# Patient Record
Sex: Male | Born: 1970
Health system: Southern US, Community
[De-identification: ages and names within clinical notes are randomized; demographics above are authoritative.]

## PROBLEM LIST (undated history)

## (undated) DIAGNOSIS — I10 Essential (primary) hypertension: Secondary | ICD-10-CM

## (undated) DIAGNOSIS — G4730 Sleep apnea, unspecified: Secondary | ICD-10-CM

## (undated) DIAGNOSIS — K219 Gastro-esophageal reflux disease without esophagitis: Secondary | ICD-10-CM

## (undated) HISTORY — DX: Essential (primary) hypertension: I10

## (undated) HISTORY — PX: OTHER SURGICAL HISTORY: SHX169

## (undated) HISTORY — DX: Sleep apnea, unspecified: G47.30

## (undated) HISTORY — DX: Gastro-esophageal reflux disease without esophagitis: K21.9

---

## 2007-09-02 DIAGNOSIS — K219 Gastro-esophageal reflux disease without esophagitis: Secondary | ICD-10-CM | POA: Insufficient documentation

## 2011-11-07 ENCOUNTER — Telehealth: Payer: Self-pay

## 2011-11-07 ENCOUNTER — Other Ambulatory Visit: Payer: Self-pay

## 2011-11-07 DIAGNOSIS — K625 Hemorrhage of anus and rectum: Secondary | ICD-10-CM

## 2011-11-07 MED ORDER — PEG-KCL-NACL-NASULF-NA ASC-C 100 G PO SOLR: 1.0000 | Freq: Once | ORAL | Status: DC

## 2011-11-07 NOTE — Telephone Encounter (Signed)
Caylei Sperry, Can you contact Blake Ibarra, he is a close friend of mine and has seen some rectal bleeding recently. I'd like him to have colonoscopy on Monday (I know it's my day off, can we do it between 9-10am at [facility] with moderate sedation). He's a bit heavy, but otherwise pretty healthy 41 year old. Can you also call to Dr. Eilleen Kempf office to get results of recent CBC (drawn yesterday). They didn't refer him, he just called himself. thanks His cell is [phone]   Pt has been scheduled and will be here today to get instructions and sign release

## 2011-11-07 NOTE — Telephone Encounter (Signed)
Pt scheduled and has been instructed and meds reviewed

## 2011-11-10 ENCOUNTER — Encounter (HOSPITAL_COMMUNITY): Admission: RE | Payer: Self-pay | Source: Ambulatory Visit

## 2011-11-10 ENCOUNTER — Ambulatory Visit (HOSPITAL_COMMUNITY)
Admission: RE | Admit: 2011-11-10 | Payer: BC Managed Care – PPO | Source: Ambulatory Visit | Admitting: Gastroenterology

## 2011-11-10 SURGERY — COLONOSCOPY
Anesthesia: Moderate Sedation

## 2011-11-19 ENCOUNTER — Telehealth: Payer: Self-pay | Admitting: Gastroenterology

## 2011-11-19 NOTE — Telephone Encounter (Signed)
Line rings busy  

## 2011-11-19 NOTE — Telephone Encounter (Signed)
Blake Ibarra, His bleeding has stopped, he feels fine.  Wants to resechedule until after thanksgiving. Can you call him to try to find a time that will work best for him.

## 2011-11-20 NOTE — Telephone Encounter (Signed)
I have tried several times to call the pt and his number does not accept block calls.  I called his work number at [employer].  Left message on machine to call back

## 2011-11-21 NOTE — Telephone Encounter (Signed)
Pt appt was changed to 01/09/12 but he would like to be on the cx list.  I will check the schedule and call the pt if we have someone to cx.

## 2011-11-26 ENCOUNTER — Other Ambulatory Visit: Payer: BC Managed Care – PPO | Admitting: Gastroenterology

## 2011-12-01 ENCOUNTER — Telehealth: Payer: Self-pay

## 2011-12-01 NOTE — Telephone Encounter (Signed)
Message copied by Donata Duff on Mon Dec 01, 2011  8:19 AM ------      Message from: Donata Duff      Created: Fri Nov 21, 2011  3:41 PM                   ----- Message -----         From: Jessee Avers, CMA         Sent: 11/21/2011   2:24 PM           To: Donata Duff, CMA            Patient would like is scheduled from 12/2- 12/12 in the early am. Like 8:30, 9, or 9:30am. Please go ahead and put patient in and then call him to confirm date. He is now scheduled for 01/09/12 which he cannot do.

## 2011-12-02 NOTE — Telephone Encounter (Signed)
12/30/11 930 am  appt changed pt to be notified

## 2011-12-02 NOTE — Telephone Encounter (Signed)
Pt has been notified that the appt was changed to 12/30/11

## 2011-12-02 NOTE — Telephone Encounter (Signed)
Yes, please add him to that date, time.  Should book without MAC, he will do just fine with moderate sedation.

## 2011-12-02 NOTE — Telephone Encounter (Signed)
Dr Maia Breslow wants an earlier time and the dates he gave the only opening would be propofol can we add him 12/30/11 930 am propofol spot?

## 2011-12-09 ENCOUNTER — Telehealth: Payer: Self-pay

## 2011-12-09 NOTE — Telephone Encounter (Signed)
Pt has been changed to 12/24/11 3 pm Pt needs to be called and notified

## 2011-12-10 NOTE — Telephone Encounter (Signed)
Pt can not make this appt either I will continue to look for an earlier time.

## 2011-12-23 NOTE — Telephone Encounter (Signed)
Pt has been rescheduled to 12/26/11 message left

## 2011-12-24 ENCOUNTER — Other Ambulatory Visit: Payer: BC Managed Care – PPO | Admitting: Gastroenterology

## 2011-12-25 NOTE — Telephone Encounter (Signed)
Pt called to reschedule the appt for colon and will call back to get another date [phone]

## 2011-12-26 ENCOUNTER — Other Ambulatory Visit: Payer: BC Managed Care – PPO | Admitting: Gastroenterology

## 2011-12-30 ENCOUNTER — Other Ambulatory Visit: Payer: BC Managed Care – PPO | Admitting: Gastroenterology

## 2012-01-09 ENCOUNTER — Other Ambulatory Visit: Payer: BC Managed Care – PPO | Admitting: Gastroenterology

## 2012-01-21 HISTORY — PX: COLONOSCOPY: SHX174

## 2012-03-30 ENCOUNTER — Ambulatory Visit (AMBULATORY_SURGERY_CENTER): Payer: BC Managed Care – PPO | Admitting: *Deleted

## 2012-03-30 ENCOUNTER — Encounter: Payer: Self-pay | Admitting: Gastroenterology

## 2012-03-30 VITALS — Ht 67.0 in | Wt 238.8 lb

## 2012-03-30 DIAGNOSIS — K625 Hemorrhage of anus and rectum: Secondary | ICD-10-CM

## 2012-03-30 MED ORDER — NA SULFATE-K SULFATE-MG SULF 17.5-3.13-1.6 GM/177ML PO SOLN: ORAL | Status: DC

## 2012-04-05 ENCOUNTER — Encounter: Payer: Self-pay | Admitting: Gastroenterology

## 2012-04-05 ENCOUNTER — Ambulatory Visit (AMBULATORY_SURGERY_CENTER): Payer: BC Managed Care – PPO | Admitting: Gastroenterology

## 2012-04-05 VITALS — BP 116/57 | HR 59 | Temp 96.2°F | Resp 21 | Ht 67.0 in | Wt 238.0 lb

## 2012-04-05 DIAGNOSIS — D126 Benign neoplasm of colon, unspecified: Secondary | ICD-10-CM

## 2012-04-05 DIAGNOSIS — K625 Hemorrhage of anus and rectum: Secondary | ICD-10-CM

## 2012-04-05 DIAGNOSIS — K648 Other hemorrhoids: Secondary | ICD-10-CM

## 2012-04-05 MED ORDER — SODIUM CHLORIDE 0.9 % IV SOLN: 500.0000 mL | INTRAVENOUS | Status: DC

## 2012-04-05 NOTE — Progress Notes (Signed)
Patient did not experience any of the following events: a burn prior to discharge; a fall within the facility; wrong site/side/patient/procedure/implant event; or a hospital transfer or hospital admission upon discharge from the facility. (G8907) Patient did not have preoperative order for IV antibiotic SSI prophylaxis. (G8918)  

## 2012-04-05 NOTE — Patient Instructions (Addendum)
YOU HAD AN ENDOSCOPIC PROCEDURE TODAY AT THE [facility]: Refer to the procedure report that was given to you for any specific questions about what was found during the examination.  If the procedure report does not answer your questions, please call your gastroenterologist to clarify.  If you requested that your care partner not be given the details of your procedure findings, then the procedure report has been included in a sealed envelope for you to review at your convenience later.  YOU SHOULD EXPECT: Some feelings of bloating in the abdomen. Passage of more gas than usual.  Walking can help get rid of the air that was put into your GI tract during the procedure and reduce the bloating. If you had a lower endoscopy (such as a colonoscopy or flexible sigmoidoscopy) you may notice spotting of blood in your stool or on the toilet paper. If you underwent a bowel prep for your procedure, then you may not have a normal bowel movement for a few days.  DIET: Your first meal following the procedure should be a light meal and then it is ok to progress to your normal diet.  A half-sandwich or bowl of soup is an example of a good first meal.  Heavy or fried foods are harder to digest and may make you feel nauseous or bloated.  Likewise meals heavy in dairy and vegetables can cause extra gas to form and this can also increase the bloating.  Drink plenty of fluids but you should avoid alcoholic beverages for 24 hours.  ACTIVITY: Your care partner should take you home directly after the procedure.  You should plan to take it easy, moving slowly for the rest of the day.  You can resume normal activity the day after the procedure however you should NOT DRIVE or use heavy machinery for 24 hours (because of the sedation medicines used during the test).    SYMPTOMS TO REPORT IMMEDIATELY: A gastroenterologist can be reached at any hour.  During normal business hours, 8:30 AM to 5:00 PM Monday through Friday,  call [phone].  After hours and on weekends, please call the GI answering service at [phone] who will take a message and have the physician on call contact you.   Following lower endoscopy (colonoscopy or flexible sigmoidoscopy):  Excessive amounts of blood in the stool  Significant tenderness or worsening of abdominal pains  Swelling of the abdomen that is new, acute  Fever of 100F or higher  Following upper endoscopy (EGD)  Vomiting of blood or coffee ground material  New chest pain or pain under the shoulder blades  Painful or persistently difficult swallowing  New shortness of breath  Fever of 100F or higher  Black, tarry-looking stools  FOLLOW UP: If any biopsies were taken you will be contacted by phone or by letter within the next 1-3 weeks.  Call your gastroenterologist if you have not heard about the biopsies in 3 weeks.  Our staff will call the home number listed on your records the next business day following your procedure to check on you and address any questions or concerns that you may have at that time regarding the information given to you following your procedure. This is a courtesy call and so if there is no answer at the home number and we have not heard from you through the emergency physician on call, we will assume that you have returned to your regular daily activities without incident.  SIGNATURES/CONFIDENTIALITY: You and/or your care   partner have signed paperwork which will be entered into your electronic medical record.  These signatures attest to the fact that that the information above on your After Visit Summary has been reviewed and is understood.  Full responsibility of the confidentiality of this discharge information lies with you and/or your care-partner.  

## 2012-04-05 NOTE — Op Note (Signed)
[facility] [address] [place], [address]   COLONOSCOPY PROCEDURE REPORT  PATIENT: Blake Ibarra, Blake Ibarra  MR#: [id number] BIRTHDATE: 11/03/1970 , 42  yrs. old GENDER: Male ENDOSCOPIST: Rachael Fee, MD PROCEDURE DATE:  04/05/2012 PROCEDURE:   Colonoscopy with snare polypectomy ASA CLASS:   Class II INDICATIONS:minor rectal bleeding. MEDICATIONS: Fentanyl 100 mcg IV, Versed 10 mg IV, and These medications were titrated to patient response per physician's verbal order  DESCRIPTION OF PROCEDURE:   After the risks benefits and alternatives of the procedure were thoroughly explained, informed consent was obtained.  A digital rectal exam revealed no abnormalities of the rectum.   The LB [id number]  endoscope was introduced through the anus and advanced to the cecum, which was identified by both the appendix and ileocecal valve. No adverse events experienced.   The quality of the prep was good.  The instrument was then slowly withdrawn as the colon was fully examined.   COLON FINDINGS: There was a single small polyp that was removed, sent to pathology.  This was sessile, 3-35mm across, sigmoid segment, removed with snare/cautery.  There were small internal hemorrhoids.  The examination was otherwise normal.  Retroflexed views revealed no abnormalities. The time to cecum=1 minutes 16 seconds.  Withdrawal time=9 minutes 19 seconds.  The scope was withdrawn and the procedure completed. COMPLICATIONS: There were no complications.  ENDOSCOPIC IMPRESSION: There was a single small polyp that was removed, sent to pathology. There were small internal hemorrhoids.  This is the source of your recent minor bleeding. The examination was otherwise normal.  RECOMMENDATIONS: If the polyp(s) removed today are proven to be adenomatous (pre-cancerous) polyps, you will need a repeat colonoscopy in 5 years.  Otherwise you should continue to follow colorectal cancer screening  guidelines for "routine risk" patients with colonoscopy in 10 years.  You will receive a letter within 1-2 weeks with the results of your biopsy as well as final recommendations.  Please call my office if you have not received a letter after 3 weeks.   eSigned:  Rachael Fee, MD 04/05/2012 9:57 AM   cc: Eric Form, MD

## 2012-04-05 NOTE — Progress Notes (Signed)
Pt very nervous when arrived in the admission area.  He did tell us he has fainted after IV stick in the past.  Elmon Kirschner, RN started IV with out difficulity.  After the IV was in and [place] was taping it in place, the pt said he felt funny.  We lowered the HOB with cool was cloth to his forehead.  Pt instructed to take slow deep breaths.  After a few minutes, the pt stated he felt fine.  I continued with admission questions. Maw

## 2012-04-06 ENCOUNTER — Telehealth: Payer: Self-pay | Admitting: *Deleted

## 2012-04-06 NOTE — Telephone Encounter (Signed)
  Follow up Call-  Call back number 04/05/2012  Post procedure Call Back phone  # [phone] hm  Permission to leave phone message Yes     Patient questions:  Do you have a fever, pain , or abdominal swelling? no Pain Score  0 *  Have you tolerated food without any problems? yes  Have you been able to return to your normal activities? yes  Do you have any questions about your discharge instructions: Diet   no Medications  no Follow up visit  no  Do you have questions or concerns about your Care? no  Actions: * If pain score is 4 or above: No action needed, pain <4.  Everything is fine.

## 2012-04-09 ENCOUNTER — Encounter: Payer: Self-pay | Admitting: Gastroenterology

## 2012-08-10 DIAGNOSIS — M7021 Olecranon bursitis, right elbow: Secondary | ICD-10-CM | POA: Insufficient documentation

## 2013-10-12 ENCOUNTER — Telehealth: Payer: Self-pay | Admitting: *Deleted

## 2013-10-12 NOTE — Telephone Encounter (Signed)
Called and lt VM , patient needs to reschedule appt. For 10/13/13.

## 2013-10-12 NOTE — Telephone Encounter (Signed)
Called and left VM message to call back to reschedule appt for 10/13/13

## 2013-10-13 ENCOUNTER — Institutional Professional Consult (permissible substitution): Payer: Self-pay | Admitting: Neurology

## 2013-10-20 ENCOUNTER — Institutional Professional Consult (permissible substitution): Payer: Self-pay | Admitting: Neurology

## 2013-10-20 ENCOUNTER — Ambulatory Visit (INDEPENDENT_AMBULATORY_CARE_PROVIDER_SITE_OTHER): Payer: BC Managed Care – PPO | Admitting: Neurology

## 2013-10-20 ENCOUNTER — Encounter: Payer: Self-pay | Admitting: Neurology

## 2013-10-20 VITALS — BP 137/82 | HR 61 | Temp 98.0°F | Resp 16 | Ht 65.0 in | Wt 245.0 lb

## 2013-10-20 DIAGNOSIS — G4710 Hypersomnia, unspecified: Secondary | ICD-10-CM

## 2013-10-20 DIAGNOSIS — G478 Other sleep disorders: Secondary | ICD-10-CM

## 2013-10-20 DIAGNOSIS — E669 Obesity, unspecified: Secondary | ICD-10-CM

## 2013-10-20 DIAGNOSIS — R0683 Snoring: Secondary | ICD-10-CM

## 2013-10-20 DIAGNOSIS — R400 Somnolence: Secondary | ICD-10-CM

## 2013-10-20 NOTE — Patient Instructions (Signed)

## 2013-10-20 NOTE — Progress Notes (Signed)
Subjective:    Patient ID: Blake Ibarra is a 43 y.o. male.  HPI    Star Age, MD, PhD [facility] [address] [place], [place] [address]  Dear Dr. Brigitte Pulse,   I saw your patient, Blake Ibarra, upon your kind request in my neurologic clinic today for initial consultation of his sleep disorder, in particular, concern for underlying obstructive sleep apnea. The patient is unaccompanied today. As you know, [name] is a 43 year old right-handed gentleman with an underlying medical history of obesity, reflux disease, hypertension, hypogonadism on testosterone replacement, and mitral valve prolapse, who reports snoring and daytime tiredness. Snoring can be mild to loud and is intermittent. He likes to sleep on his sides, he does not sleep very much on his back. He was able to lose a lot of weight some 15 years ago, but gained some back. He goes to the gym 5-6 times per week at 5:30. He has 2 young boys. 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 He is a non-smoker, he drinks alcohol very occasionally, he drinks coffee once in the morning.   His typical bedtime is reported to be around 10 to 10:30 PM and usual wake time is around 5 AM. Sleep onset typically occurs within minutes. He reports feeling marginally rested upon awakening. He wakes up on an average 2 times in the middle of the night and has to go to the bathroom 0 to 1 times on a typical night. He reports excessive daytime somnolence (EDS) and His Epworth Sleepiness Score (ESS) is 6/24 today. He has not fallen asleep while driving. The patient has not been taking a planned nap.  He has been known to snore for the past few years. There are no overt apneas or gasping for air. He denies morning headaches. There is report of nighttime reflux, with no nighttime cough experienced. The patient has not noted any RLS symptoms and is not known to kick while asleep or before falling asleep. There is no family history of RLS or OSA,  but father is obese.   He denies cataplexy, sleep paralysis, hypnagogic or hypnopompic hallucinations, or sleep attacks. He does not report any vivid dreams, nightmares, dream enactments, or parasomnias, such as sleep talking or sleep walking. The patient has not had a sleep study or a home sleep test.   His bedroom is usually dark and cool. There is a TV in the bedroom and usually it is not on at night.   His Past Medical History Is Significant For: Past Medical History  Diagnosis Date  . Hypertension   . GERD (gastroesophageal reflux disease)     His Past Surgical History Is Significant For: Past Surgical History  Procedure Laterality Date  . No prior surgery      His Family History Is Significant For: Family History  Problem Relation Age of Onset  . Colon cancer Neg Hx   . Esophageal cancer Neg Hx   . Rectal cancer Neg Hx   . Stomach cancer Paternal Grandfather     dx in early 66's - not 100% sure    His Social History Is Significant For: History   Social History  . Marital Status: Married    Spouse Name: N/A    Number of Children: N/A  . Years of Education: N/A   Social History Main Topics  . Smoking status: Never Smoker   . Smokeless tobacco: Never Used  . Alcohol Use: Yes     Comment: occasional  .  Drug Use: No  . Sexual Activity: Not on file   Other Topics Concern  . Not on file   Social History Narrative  . No narrative on file    His Allergies Are:  No Known Allergies:   His Current Medications Are:  Outpatient Encounter Prescriptions as of 10/20/2013  Medication Sig  . amLODipine (NORVASC) 5 MG tablet Take 5 mg by mouth daily.  . Cholecalciferol (VITAMIN D-3) 5000 UNITS TABS Take by mouth daily.  Marland Kitchen lisinopril (PRINIVIL,ZESTRIL) 40 MG tablet Take 40 mg by mouth daily.  . Omega-3 Fatty Acids (FISH OIL) 1200 MG CAPS Take by mouth 2 (two) times daily.  . RABEprazole (ACIPHEX) 20 MG tablet Take 20 mg by mouth daily.  . Testosterone (AXIRON TD) Place  onto the skin daily.  :  Review of Systems:  Out of a complete 14 point review of systems, all are reviewed and negative with the exception of these symptoms as listed below:   Review of Systems  Constitutional: Positive for fatigue.  Neurological:       Snoring, insomnia, sleepiness,  Psychiatric/Behavioral:       Not enough sleep    Objective:  Neurologic Exam  Physical Exam Physical Examination:   Filed Vitals:   10/20/13 0828  BP: 137/82  Pulse: 61  Temp: 98 F (36.7 C)  Resp: 16    General Examination: The patient is a very pleasant 43 y.o. male in no acute distress. He appears well-developed and well-nourished and well groomed.   HEENT: Normocephalic, atraumatic, pupils are equal, round and reactive to light and accommodation. Funduscopic exam is normal with sharp disc margins noted. Extraocular tracking is good without limitation to gaze excursion or nystagmus noted. Normal smooth pursuit is noted. Hearing is grossly intact. Tympanic membranes are clear bilaterally. Face is symmetric with normal facial animation and normal facial sensation. Speech is clear with no dysarthria noted. There is no hypophonia. There is no lip, neck/head, jaw or voice tremor. Neck is supple with full range of passive and active motion. There are no carotid bruits on auscultation. Oropharynx exam reveals: mild mouth dryness, adequate dental hygiene and moderate airway crowding, due to narrow airway entry and thicker soft palate and elongated. Mallampati is class II. Tongue protrudes centrally and palate elevates symmetrically. Tonsils are small. Neck size is 17.25 inches. He has a nearly absent overbite. Nasal inspection reveals no significant nasal mucosal bogginess or redness and no septal deviation.   Chest: Clear to auscultation without wheezing, rhonchi or crackles noted.  Heart: S1+S2+0, regular and normal without murmurs, rubs or gallops noted.   Abdomen: Soft, non-tender and non-distended  with normal bowel sounds appreciated on auscultation.  Extremities: There is no pitting edema in the distal lower extremities bilaterally. Pedal pulses are intact.  Skin: Warm and dry without trophic changes noted. There are no varicose veins.  Musculoskeletal: exam reveals no obvious joint deformities, tenderness or joint swelling or erythema.   Neurologically:  Mental status: The patient is awake, alert and oriented in all 4 spheres. His immediate and remote memory, attention, language skills and fund of knowledge are appropriate. There is no evidence of aphasia, agnosia, apraxia or anomia. Speech is clear with normal prosody and enunciation. Thought process is linear. Mood is normal and affect is normal.  Cranial nerves II - XII are as described above under HEENT exam. In addition: shoulder shrug is normal with equal shoulder height noted. Motor exam: Normal bulk, strength and tone is noted. There is  no drift, tremor or rebound. Romberg is negative. Reflexes are 2+ throughout. Babinski: Toes are flexor bilaterally. Fine motor skills and coordination: intact with normal finger taps, normal hand movements, normal rapid alternating patting, normal foot taps and normal foot agility.  Cerebellar testing: No dysmetria or intention tremor on finger to nose testing. Heel to shin is unremarkable bilaterally. There is no truncal or gait ataxia.  Sensory exam: intact to light touch, pinprick, vibration, temperature sense in the upper and lower extremities.  Gait, station and balance: He stands easily. No veering to one side is noted. No leaning to one side is noted. Posture is age-appropriate and stance is narrow based. Gait shows normal stride length and normal pace. No problems turning are noted. He turns en bloc. Tandem walk is unremarkable. Intact toe and heel stance is noted.               Assessment and Plan:   In summary, Taelor Moncada is a very pleasant 43 y.o.-year old male with an underlying  medical history of obesity, reflux disease, hypertension, hypogonadism on testosterone replacement, and mitral valve prolapse, with a history and physical exam concerning for obstructive sleep apnea (OSA). Well he does not necessarily have a telltale history of crescendo snoring and obvious pauses in his sleep or gasping sensations, his larger next size, his obesity and tighter looking airway are certainly concerning enough to have him tested for OSA in particular since he had symptoms of nonrestorative sleep and daytime tiredness. I had a long chat with the patient about my findings and the diagnosis of OSA, its prognosis and treatment options. We talked about medical treatments, surgical interventions and non-pharmacological approaches. I explained in particular the risks and ramifications of untreated moderate to severe OSA, especially with respect to developing cardiovascular disease down the Road, including congestive heart failure, difficult to treat hypertension, cardiac arrhythmias, or stroke. Even type 2 diabetes has, in part, been linked to untreated OSA. Symptoms of untreated OSA include daytime sleepiness, memory problems, mood irritability and mood disorder such as depression and anxiety, lack of energy, as well as recurrent headaches, especially morning headaches. We talked about trying to maintain a healthy lifestyle in general, as well as the importance of weight control. I encouraged the patient to eat healthy, exercise daily and keep well hydrated, to keep a scheduled bedtime and wake time routine, to not skip any meals and eat healthy snacks in between meals. I advised the patient not to drive when feeling sleepy. I recommended the following at this time: sleep study with potential positive airway pressure titration. (We will score hypopneas at 3% and split the sleep study into diagnostic and treatment portion, if the estimated. 2 hour AHI is >15/h).   I explained the sleep test procedure to  the patient and also outlined possible surgical and non-surgical treatment options of OSA, including the use of a custom-made dental device (which would require a referral to a specialist dentist or oral surgeon), upper airway surgical options, such as pillar implants, radiofrequency surgery, tongue base surgery, and UPPP (which would involve a referral to an ENT surgeon). Rarely, jaw surgery such as mandibular advancement may be considered.  I also explained the CPAP treatment option to the patient, who indicated that he would be willing to try CPAP if the need arises. I explained the importance of being compliant with PAP treatment, not only for insurance purposes but primarily to improve His symptoms, and for the patient's long term health benefit, including  to reduce His cardiovascular risks. I answered all his questions today and the patient was in agreement. I would like to see him back after the sleep study is completed and encouraged him to call with any interim questions, concerns, problems or updates.   Thank you very much for allowing me to participate in the care of this nice patient. If I can be of any further assistance to you please do not hesitate to call me at [phone].  Sincerely,   Star Age, MD, PhD

## 2014-01-05 ENCOUNTER — Ambulatory Visit (INDEPENDENT_AMBULATORY_CARE_PROVIDER_SITE_OTHER): Payer: BC Managed Care – PPO | Admitting: Neurology

## 2014-01-05 VITALS — BP 139/76 | HR 51

## 2014-01-05 DIAGNOSIS — G4761 Periodic limb movement disorder: Secondary | ICD-10-CM

## 2014-01-05 DIAGNOSIS — G4733 Obstructive sleep apnea (adult) (pediatric): Secondary | ICD-10-CM

## 2014-01-06 NOTE — Sleep Study (Signed)
Please see the scanned sleep study interpretation located in the Procedure tab within the chart review section

## 2014-01-16 ENCOUNTER — Encounter: Payer: Self-pay | Admitting: Neurology

## 2014-01-16 ENCOUNTER — Telehealth: Payer: Self-pay | Admitting: Neurology

## 2014-01-16 DIAGNOSIS — G4733 Obstructive sleep apnea (adult) (pediatric): Secondary | ICD-10-CM

## 2014-01-16 NOTE — Telephone Encounter (Signed)
Please call and notify the patient that the recent sleep study did confirm the diagnosis of obstructive sleep apnea. Overall, Findings are mild to moderate. However, given his sleep-related complaints I do believe we should give sleep apnea treatment a try and we can do this with an autoPAP trial at home. Pls explain autoPAP to the patient and we can set up a trial at home through a DME company. I will go ahead and place an order in his chart. Thanks. Please ask him to make a followup appointment for about 6-8 weeks. Star Age, MD, PhD [facility])

## 2014-01-16 NOTE — Telephone Encounter (Signed)
Patient was contacted and provided the results of his over night sleep study in which there was a positive diagnosis of sleep apnea.  Patient was informed that a 30 day trial of CPAP had been recommended.  Patient stated that he was in the process of losing weight and did not want to use a machine.  Patient was informed of the risks of untreated sleep apnea.  Patient requested rather a follow up appointment with Dr. Rexene Alberts.  Patient to be contacted to schedule that appointment.  Dr. Marton Redwood was faxed a copy of the results.

## 2014-02-06 ENCOUNTER — Telehealth: Payer: Self-pay | Admitting: Neurology

## 2014-02-06 ENCOUNTER — Ambulatory Visit (INDEPENDENT_AMBULATORY_CARE_PROVIDER_SITE_OTHER): Payer: BLUE CROSS/BLUE SHIELD | Admitting: Adult Health

## 2014-02-06 ENCOUNTER — Encounter: Payer: Self-pay | Admitting: Neurology

## 2014-02-06 VITALS — BP 133/80 | HR 57 | Ht 67.0 in | Wt 238.6 lb

## 2014-02-06 DIAGNOSIS — G4733 Obstructive sleep apnea (adult) (pediatric): Secondary | ICD-10-CM

## 2014-02-06 DIAGNOSIS — E669 Obesity, unspecified: Secondary | ICD-10-CM

## 2014-02-06 DIAGNOSIS — G4719 Other hypersomnia: Secondary | ICD-10-CM

## 2014-02-06 NOTE — Telephone Encounter (Signed)
Patient is a no show for today's appointment (02/06/14)

## 2014-02-06 NOTE — Patient Instructions (Signed)
Sleep Apnea  Sleep apnea is a sleep disorder characterized by abnormal pauses in breathing while you sleep. When your breathing pauses, the level of oxygen in your blood decreases. This causes you to move out of deep sleep and into light sleep. As a result, your quality of sleep is poor, and the system that carries your blood throughout your body (cardiovascular system) experiences stress. If sleep apnea remains untreated, the following conditions can develop:  High blood pressure (hypertension).  Coronary artery disease.  Inability to achieve or maintain an erection (impotence).  Impairment of your thought process (cognitive dysfunction). There are three types of sleep apnea: 1. Obstructive sleep apnea--Pauses in breathing during sleep because of a blocked airway. 2. Central sleep apnea--Pauses in breathing during sleep because the area of the brain that controls your breathing does not send the correct signals to the muscles that control breathing. 3. Mixed sleep apnea--A combination of both obstructive and central sleep apnea. RISK FACTORS The following risk factors can increase your risk of developing sleep apnea:  Being overweight.  Smoking.  Having narrow passages in your nose and throat.  Being of older age.  Being male.  Alcohol use.  Sedative and tranquilizer use.  Ethnicity. Among individuals younger than 35 years, African Americans are at increased risk of sleep apnea. SYMPTOMS   Difficulty staying asleep.  Daytime sleepiness and fatigue.  Loss of energy.  Irritability.  Loud, heavy snoring.  Morning headaches.  Trouble concentrating.  Forgetfulness.  Decreased interest in sex. DIAGNOSIS  In order to diagnose sleep apnea, your caregiver will perform a physical examination. Your caregiver may suggest that you take a home sleep test. Your caregiver may also recommend that you spend the night in a sleep lab. In the sleep lab, several monitors record  information about your heart, lungs, and brain while you sleep. Your leg and arm movements and blood oxygen level are also recorded. TREATMENT The following actions may help to resolve mild sleep apnea:  Sleeping on your side.   Using a decongestant if you have nasal congestion.   Avoiding the use of depressants, including alcohol, sedatives, and narcotics.   Losing weight and modifying your diet if you are overweight. There also are devices and treatments to help open your airway:  Oral appliances. These are custom-made mouthpieces that shift your lower jaw forward and slightly open your bite. This opens your airway.  Devices that create positive airway pressure. This positive pressure "splints" your airway open to help you breathe better during sleep. The following devices create positive airway pressure:  Continuous positive airway pressure (CPAP) device. The CPAP device creates a continuous level of air pressure with an air pump. The air is delivered to your airway through a mask while you sleep. This continuous pressure keeps your airway open.  Nasal expiratory positive airway pressure (EPAP) device. The EPAP device creates positive air pressure as you exhale. The device consists of single-use valves, which are inserted into each nostril and held in place by adhesive. The valves create very little resistance when you inhale but create much more resistance when you exhale. That increased resistance creates the positive airway pressure. This positive pressure while you exhale keeps your airway open, making it easier to breath when you inhale again.  Bilevel positive airway pressure (BPAP) device. The BPAP device is used mainly in patients with central sleep apnea. This device is similar to the CPAP device because it also uses an air pump to deliver continuous air pressure   through a mask. However, with the BPAP machine, the pressure is set at two different levels. The pressure when you  exhale is lower than the pressure when you inhale.  Surgery. Typically, surgery is only done if you cannot comply with less invasive treatments or if the less invasive treatments do not improve your condition. Surgery involves removing excess tissue in your airway to create a wider passage way. Document Released: 12/27/2001 Document Revised: 05/03/2012 Document Reviewed: 05/15/2011 ExitCare Patient Information 2015 [employer]. This information is not intended to replace advice given to you by your health care provider. Make sure you discuss any questions you have with your health care provider.  

## 2014-02-06 NOTE — Progress Notes (Addendum)
PATIENT: Blake Ibarra DOB: 02/18/1970  REASON FOR VISIT: follow up- obstructive sleep apnea, obesity, daytime sleepiness HISTORY FROM: patient  HISTORY OF PRESENT ILLNESS: Mr. Blake Ibarra is a 44 year old male with a history of obstructive sleep apnea, obesity and daytime sleepiness. The patient did have a sleep study which revealed obstructive sleep apnea. It was recommended that he try the AutoPAP machine for a 30 day  trial. The patient opted not to use the machine. He states that he was in the process of losing weight. The patient returns to the office today for a follow-up. He states that he has  weight since his first visit with Dr. Rexene Alberts. The patient has a lot of questions about starting the AutoPAP versus waiting to see if he loses weight. He states that in the interim he has felt better with the weight loss. He feels that he is sleeping better at night. The patient is also concerned the AutoPAP will affect his marriage. He is concerned that with wearing a mask at his age 78 affect intimacy. This is another reason that he is hesitant to start Autopap versus just losing weight. The patient returns today with several questions or following his treatment.  HISTORY 10/20/13 Rexene Alberts): Mr. Blake Ibarra is a 44 year old right-handed gentleman with an underlying medical history of obesity, reflux disease, hypertension, hypogonadism on testosterone replacement, and mitral valve prolapse, who reports snoring and daytime tiredness. Snoring can be mild to loud and is intermittent. He likes to sleep on his sides, he does not sleep very much on his back. He was able to lose a lot of weight some 15 years ago, but gained some back. He goes to the gym 5-6 times per week at 5:30. He has 2 young boys. 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 He is a non-smoker, he drinks alcohol very occasionally, he drinks coffee once in the morning.   His typical bedtime is reported to be around 10 to 10:30 PM and usual wake time is around 5 AM. Sleep  onset typically occurs within minutes. He reports feeling marginally rested upon awakening. He wakes up on an average 2 times in the middle of the night and has to go to the bathroom 0 to 1 times on a typical night. He reports excessive daytime somnolence (EDS) and His Epworth Sleepiness Score (ESS) is 6/24 today. He has not fallen asleep while driving. The patient has not been taking a planned nap.  He has been known to snore for the past few years. There are no overt apneas or gasping for air. He denies morning headaches. There is report of nighttime reflux, with no nighttime cough experienced. The patient has not noted any RLS symptoms and is not known to kick while asleep or before falling asleep. There is no family history of RLS or OSA, but father is obese.  He denies cataplexy, sleep paralysis, hypnagogic or hypnopompic hallucinations, or sleep attacks. He does not report any vivid dreams, nightmares, dream enactments, or parasomnias, such as sleep talking or sleep walking. The patient has not had a sleep study or a home sleep test.  His bedroom is usually dark and cool. There is a TV in the bedroom and usually it is not on at night.   REVIEW OF SYSTEMS: Out of a complete 14 system review of symptoms, the patient complains only of the following symptoms, and all other reviewed systems are negative.  Fatigue, daytime sleepiness, snoring, nervous/anxious  ALLERGIES: No Known Allergies  HOME MEDICATIONS: Outpatient Prescriptions Prior  to Visit  Medication Sig Dispense Refill  . amLODipine (NORVASC) 5 MG tablet Take 5 mg by mouth daily.    . Cholecalciferol (VITAMIN D-3) 5000 UNITS TABS Take by mouth daily.    Marland Kitchen lisinopril (PRINIVIL,ZESTRIL) 40 MG tablet Take 40 mg by mouth daily.    . Omega-3 Fatty Acids (FISH OIL) 1200 MG CAPS Take by mouth 2 (two) times daily.    . RABEprazole (ACIPHEX) 20 MG tablet Take 20 mg by mouth daily.    . Testosterone (AXIRON TD) Place onto the skin daily.       No facility-administered medications prior to visit.    PAST MEDICAL HISTORY: Past Medical History  Diagnosis Date  . Hypertension   . GERD (gastroesophageal reflux disease)     PAST SURGICAL HISTORY: Past Surgical History  Procedure Laterality Date  . No prior surgery      FAMILY HISTORY: Family History  Problem Relation Age of Onset  . Colon cancer Neg Hx   . Esophageal cancer Neg Hx   . Rectal cancer Neg Hx   . Hypothyroidism Mother   . Stroke Maternal Grandmother   . Glaucoma Maternal Grandmother   . Macular degeneration Maternal Grandmother   . Cancer Maternal Grandfather     stomach    SOCIAL HISTORY: History   Social History  . Marital Status: Married    Spouse Name: N/A    Number of Children: N/A  . Years of Education: N/A   Occupational History  . Not on file.   Social History Main Topics  . Smoking status: Never Smoker   . Smokeless tobacco: Never Used  . Alcohol Use: Yes     Comment: occasional  . Drug Use: No  . Sexual Activity: Not on file   Other Topics Concern  . Not on file   Social History Narrative      PHYSICAL EXAM  Filed Vitals:   02/06/14 1551  BP: 133/80  Pulse: 57  Height: 5\' 7"  (1.702 m)  Weight: 238 lb 9.6 oz (108.228 kg)   Body mass index is 37.36 kg/(m^2). Generalized: Well developed, in no acute distress  Neck: Circumference 18 inches, Mallampati 4+  Neurological examination  Mentation: Alert oriented to time, place, history taking. Follows all commands speech and language fluent Cranial nerve II-XII:  Extraocular movements were full, visual field were full on confrontational test. Uvula tongue midline. Motor: The motor testing reveals 5 over 5 strength of all 4 extremities. Good symmetric motor tone is noted throughout.  Coordination: Cerebellar testing reveals good finger-nose-finger and heel-to-shin bilaterally.  Gait and station: Gait is normal.  Reflexes: Deep tendon reflexes are symmetric and normal  bilaterally.  Marland Kitchen   DIAGNOSTIC DATA (LABS, IMAGING, TESTING) - I reviewed patient records, labs, notes, testing and imaging myself where available.     ASSESSMENT AND PLAN 44 y.o. year old male  has a past medical history of Hypertension and GERD (gastroesophageal reflux disease). here with:   1. Obstructive sleep apnea.  2. Obesity 3. Daytime sleepiness  The patient has lost 7lbs since his first visit with at [facility]. I have advised the patient that Dr. Guadelupe Sabin recommendation is that he use the AutoPap. The patient can continue to lose weight while on the machine. The patient's sleep apnea with continued to be monitored and if the patient has significant weight loss a repeat sleep study may be indicated. This was all explained to the patient in detail. I went over his sleep study again with  him. He verbalized understanding. At this time he would like to consider his options and will call and let us know what he decides he wants to do.  I spent 25 minutes with this patient 50% of that time was spent counseling the patient on risks associated with untreated sleep apnea. I also discussed the patient's treatment options in detail as well as reviewed his recent sleep study.   Ward Givens, MSN, NP-C 02/06/2014, 4:13 PM [facility] [address] [place], [place] [address] [phone]  Note: This document was prepared with digital dictation and possible smart phrase technology. Any transcriptional errors that result from this process are unintentional.  I reviewed the above note and documentation by the Nurse Practitioner and agree with the history, physical exam, assessment and plan as outlined above. I was immediately available for face-to-face consultation. Star Age, MD, PhD [facility])

## 2014-06-08 ENCOUNTER — Ambulatory Visit: Payer: BLUE CROSS/BLUE SHIELD | Admitting: Neurology

## 2015-05-22 DIAGNOSIS — F411 Generalized anxiety disorder: Secondary | ICD-10-CM | POA: Diagnosis not present

## 2015-05-25 DIAGNOSIS — F438 Other reactions to severe stress: Secondary | ICD-10-CM | POA: Diagnosis not present

## 2015-05-29 DIAGNOSIS — F411 Generalized anxiety disorder: Secondary | ICD-10-CM | POA: Diagnosis not present

## 2015-06-06 DIAGNOSIS — F411 Generalized anxiety disorder: Secondary | ICD-10-CM | POA: Diagnosis not present

## 2015-06-11 DIAGNOSIS — F411 Generalized anxiety disorder: Secondary | ICD-10-CM | POA: Diagnosis not present

## 2015-06-15 DIAGNOSIS — F411 Generalized anxiety disorder: Secondary | ICD-10-CM | POA: Diagnosis not present

## 2015-06-15 DIAGNOSIS — R079 Chest pain, unspecified: Secondary | ICD-10-CM | POA: Diagnosis not present

## 2015-06-15 DIAGNOSIS — F438 Other reactions to severe stress: Secondary | ICD-10-CM | POA: Diagnosis not present

## 2015-06-15 DIAGNOSIS — I10 Essential (primary) hypertension: Secondary | ICD-10-CM | POA: Diagnosis not present

## 2015-06-15 DIAGNOSIS — Z6838 Body mass index (BMI) 38.0-38.9, adult: Secondary | ICD-10-CM | POA: Diagnosis not present

## 2015-06-19 DIAGNOSIS — F411 Generalized anxiety disorder: Secondary | ICD-10-CM | POA: Diagnosis not present

## 2015-06-25 DIAGNOSIS — F438 Other reactions to severe stress: Secondary | ICD-10-CM | POA: Diagnosis not present

## 2015-06-28 DIAGNOSIS — F411 Generalized anxiety disorder: Secondary | ICD-10-CM | POA: Diagnosis not present

## 2015-07-26 DIAGNOSIS — F411 Generalized anxiety disorder: Secondary | ICD-10-CM | POA: Diagnosis not present

## 2015-08-01 DIAGNOSIS — F438 Other reactions to severe stress: Secondary | ICD-10-CM | POA: Diagnosis not present

## 2015-08-02 DIAGNOSIS — F411 Generalized anxiety disorder: Secondary | ICD-10-CM | POA: Diagnosis not present

## 2015-08-07 DIAGNOSIS — Z6838 Body mass index (BMI) 38.0-38.9, adult: Secondary | ICD-10-CM | POA: Diagnosis not present

## 2015-08-07 DIAGNOSIS — R58 Hemorrhage, not elsewhere classified: Secondary | ICD-10-CM | POA: Diagnosis not present

## 2015-08-08 DIAGNOSIS — F438 Other reactions to severe stress: Secondary | ICD-10-CM | POA: Diagnosis not present

## 2015-08-09 ENCOUNTER — Ambulatory Visit: Payer: BLUE CROSS/BLUE SHIELD | Admitting: Podiatry

## 2015-08-09 DIAGNOSIS — F411 Generalized anxiety disorder: Secondary | ICD-10-CM | POA: Diagnosis not present

## 2015-09-12 DIAGNOSIS — R05 Cough: Secondary | ICD-10-CM | POA: Diagnosis not present

## 2015-09-12 DIAGNOSIS — Z6839 Body mass index (BMI) 39.0-39.9, adult: Secondary | ICD-10-CM | POA: Diagnosis not present

## 2015-09-19 DIAGNOSIS — F411 Generalized anxiety disorder: Secondary | ICD-10-CM | POA: Diagnosis not present

## 2015-10-02 DIAGNOSIS — F411 Generalized anxiety disorder: Secondary | ICD-10-CM | POA: Diagnosis not present

## 2015-10-09 DIAGNOSIS — F411 Generalized anxiety disorder: Secondary | ICD-10-CM | POA: Diagnosis not present

## 2015-10-16 DIAGNOSIS — F411 Generalized anxiety disorder: Secondary | ICD-10-CM | POA: Diagnosis not present

## 2015-10-18 DIAGNOSIS — F438 Other reactions to severe stress: Secondary | ICD-10-CM | POA: Diagnosis not present

## 2015-10-26 DIAGNOSIS — F411 Generalized anxiety disorder: Secondary | ICD-10-CM | POA: Diagnosis not present

## 2015-10-30 DIAGNOSIS — F411 Generalized anxiety disorder: Secondary | ICD-10-CM | POA: Diagnosis not present

## 2015-11-02 DIAGNOSIS — F438 Other reactions to severe stress: Secondary | ICD-10-CM | POA: Diagnosis not present

## 2015-11-08 DIAGNOSIS — F411 Generalized anxiety disorder: Secondary | ICD-10-CM | POA: Diagnosis not present

## 2015-11-13 DIAGNOSIS — F411 Generalized anxiety disorder: Secondary | ICD-10-CM | POA: Diagnosis not present

## 2015-11-15 DIAGNOSIS — Z23 Encounter for immunization: Secondary | ICD-10-CM | POA: Diagnosis not present

## 2015-11-16 DIAGNOSIS — F438 Other reactions to severe stress: Secondary | ICD-10-CM | POA: Diagnosis not present

## 2015-11-20 DIAGNOSIS — F411 Generalized anxiety disorder: Secondary | ICD-10-CM | POA: Diagnosis not present

## 2015-11-27 DIAGNOSIS — H01025 Squamous blepharitis left lower eyelid: Secondary | ICD-10-CM | POA: Diagnosis not present

## 2015-11-27 DIAGNOSIS — H01022 Squamous blepharitis right lower eyelid: Secondary | ICD-10-CM | POA: Diagnosis not present

## 2015-11-27 DIAGNOSIS — H04123 Dry eye syndrome of bilateral lacrimal glands: Secondary | ICD-10-CM | POA: Diagnosis not present

## 2015-11-27 DIAGNOSIS — F411 Generalized anxiety disorder: Secondary | ICD-10-CM | POA: Diagnosis not present

## 2015-11-27 DIAGNOSIS — H16223 Keratoconjunctivitis sicca, not specified as Sjogren's, bilateral: Secondary | ICD-10-CM | POA: Diagnosis not present

## 2015-11-30 DIAGNOSIS — F438 Other reactions to severe stress: Secondary | ICD-10-CM | POA: Diagnosis not present

## 2015-12-06 DIAGNOSIS — F411 Generalized anxiety disorder: Secondary | ICD-10-CM | POA: Diagnosis not present

## 2015-12-18 DIAGNOSIS — F411 Generalized anxiety disorder: Secondary | ICD-10-CM | POA: Diagnosis not present

## 2015-12-19 DIAGNOSIS — F438 Other reactions to severe stress: Secondary | ICD-10-CM | POA: Diagnosis not present

## 2015-12-25 DIAGNOSIS — F411 Generalized anxiety disorder: Secondary | ICD-10-CM | POA: Diagnosis not present

## 2015-12-28 DIAGNOSIS — F438 Other reactions to severe stress: Secondary | ICD-10-CM | POA: Diagnosis not present

## 2016-01-01 DIAGNOSIS — F411 Generalized anxiety disorder: Secondary | ICD-10-CM | POA: Diagnosis not present

## 2016-01-08 DIAGNOSIS — F411 Generalized anxiety disorder: Secondary | ICD-10-CM | POA: Diagnosis not present

## 2016-01-09 DIAGNOSIS — F438 Other reactions to severe stress: Secondary | ICD-10-CM | POA: Diagnosis not present

## 2016-01-25 DIAGNOSIS — F438 Other reactions to severe stress: Secondary | ICD-10-CM | POA: Diagnosis not present

## 2016-02-04 DIAGNOSIS — H16223 Keratoconjunctivitis sicca, not specified as Sjogren's, bilateral: Secondary | ICD-10-CM | POA: Diagnosis not present

## 2016-02-04 DIAGNOSIS — H01022 Squamous blepharitis right lower eyelid: Secondary | ICD-10-CM | POA: Diagnosis not present

## 2016-02-04 DIAGNOSIS — H01025 Squamous blepharitis left lower eyelid: Secondary | ICD-10-CM | POA: Diagnosis not present

## 2016-02-04 DIAGNOSIS — H04123 Dry eye syndrome of bilateral lacrimal glands: Secondary | ICD-10-CM | POA: Diagnosis not present

## 2016-02-12 DIAGNOSIS — F411 Generalized anxiety disorder: Secondary | ICD-10-CM | POA: Diagnosis not present

## 2016-02-15 DIAGNOSIS — F438 Other reactions to severe stress: Secondary | ICD-10-CM | POA: Diagnosis not present

## 2016-02-19 DIAGNOSIS — F411 Generalized anxiety disorder: Secondary | ICD-10-CM | POA: Diagnosis not present

## 2016-02-26 DIAGNOSIS — F411 Generalized anxiety disorder: Secondary | ICD-10-CM | POA: Diagnosis not present

## 2016-02-29 DIAGNOSIS — F438 Other reactions to severe stress: Secondary | ICD-10-CM | POA: Diagnosis not present

## 2016-03-04 DIAGNOSIS — F411 Generalized anxiety disorder: Secondary | ICD-10-CM | POA: Diagnosis not present

## 2016-03-11 DIAGNOSIS — Z6839 Body mass index (BMI) 39.0-39.9, adult: Secondary | ICD-10-CM | POA: Diagnosis not present

## 2016-03-11 DIAGNOSIS — R05 Cough: Secondary | ICD-10-CM | POA: Diagnosis not present

## 2016-03-11 DIAGNOSIS — R042 Hemoptysis: Secondary | ICD-10-CM | POA: Diagnosis not present

## 2016-03-14 DIAGNOSIS — F438 Other reactions to severe stress: Secondary | ICD-10-CM | POA: Diagnosis not present

## 2016-03-18 DIAGNOSIS — F411 Generalized anxiety disorder: Secondary | ICD-10-CM | POA: Diagnosis not present

## 2016-03-25 DIAGNOSIS — F411 Generalized anxiety disorder: Secondary | ICD-10-CM | POA: Diagnosis not present

## 2016-03-28 DIAGNOSIS — F438 Other reactions to severe stress: Secondary | ICD-10-CM | POA: Diagnosis not present

## 2016-04-01 DIAGNOSIS — F411 Generalized anxiety disorder: Secondary | ICD-10-CM | POA: Diagnosis not present

## 2016-04-07 DIAGNOSIS — J189 Pneumonia, unspecified organism: Secondary | ICD-10-CM | POA: Diagnosis not present

## 2016-04-07 DIAGNOSIS — R05 Cough: Secondary | ICD-10-CM | POA: Diagnosis not present

## 2016-04-07 DIAGNOSIS — J0100 Acute maxillary sinusitis, unspecified: Secondary | ICD-10-CM | POA: Diagnosis not present

## 2016-04-07 DIAGNOSIS — Z6839 Body mass index (BMI) 39.0-39.9, adult: Secondary | ICD-10-CM | POA: Diagnosis not present

## 2016-04-14 DIAGNOSIS — F438 Other reactions to severe stress: Secondary | ICD-10-CM | POA: Diagnosis not present

## 2016-04-15 DIAGNOSIS — F411 Generalized anxiety disorder: Secondary | ICD-10-CM | POA: Diagnosis not present

## 2016-05-02 DIAGNOSIS — F438 Other reactions to severe stress: Secondary | ICD-10-CM | POA: Diagnosis not present

## 2016-05-06 DIAGNOSIS — F411 Generalized anxiety disorder: Secondary | ICD-10-CM | POA: Diagnosis not present

## 2016-05-14 DIAGNOSIS — F438 Other reactions to severe stress: Secondary | ICD-10-CM | POA: Diagnosis not present

## 2016-05-20 DIAGNOSIS — F411 Generalized anxiety disorder: Secondary | ICD-10-CM | POA: Diagnosis not present

## 2016-05-22 DIAGNOSIS — Z0000 Encounter for general adult medical examination without abnormal findings: Secondary | ICD-10-CM | POA: Diagnosis not present

## 2016-05-22 DIAGNOSIS — Z125 Encounter for screening for malignant neoplasm of prostate: Secondary | ICD-10-CM | POA: Diagnosis not present

## 2016-05-22 DIAGNOSIS — I10 Essential (primary) hypertension: Secondary | ICD-10-CM | POA: Diagnosis not present

## 2016-05-29 DIAGNOSIS — E8881 Metabolic syndrome: Secondary | ICD-10-CM | POA: Diagnosis not present

## 2016-05-29 DIAGNOSIS — Z125 Encounter for screening for malignant neoplasm of prostate: Secondary | ICD-10-CM | POA: Diagnosis not present

## 2016-05-29 DIAGNOSIS — Z1389 Encounter for screening for other disorder: Secondary | ICD-10-CM | POA: Diagnosis not present

## 2016-05-29 DIAGNOSIS — E784 Other hyperlipidemia: Secondary | ICD-10-CM | POA: Diagnosis not present

## 2016-05-29 DIAGNOSIS — Z0000 Encounter for general adult medical examination without abnormal findings: Secondary | ICD-10-CM | POA: Diagnosis not present

## 2016-05-29 DIAGNOSIS — E291 Testicular hypofunction: Secondary | ICD-10-CM | POA: Diagnosis not present

## 2016-05-29 DIAGNOSIS — F411 Generalized anxiety disorder: Secondary | ICD-10-CM | POA: Diagnosis not present

## 2016-05-29 DIAGNOSIS — I10 Essential (primary) hypertension: Secondary | ICD-10-CM | POA: Diagnosis not present

## 2016-06-03 DIAGNOSIS — F411 Generalized anxiety disorder: Secondary | ICD-10-CM | POA: Diagnosis not present

## 2016-06-06 DIAGNOSIS — H01022 Squamous blepharitis right lower eyelid: Secondary | ICD-10-CM | POA: Diagnosis not present

## 2016-06-06 DIAGNOSIS — H01025 Squamous blepharitis left lower eyelid: Secondary | ICD-10-CM | POA: Diagnosis not present

## 2016-06-06 DIAGNOSIS — H16223 Keratoconjunctivitis sicca, not specified as Sjogren's, bilateral: Secondary | ICD-10-CM | POA: Diagnosis not present

## 2016-06-10 DIAGNOSIS — F411 Generalized anxiety disorder: Secondary | ICD-10-CM | POA: Diagnosis not present

## 2016-06-13 DIAGNOSIS — F438 Other reactions to severe stress: Secondary | ICD-10-CM | POA: Diagnosis not present

## 2016-06-26 DIAGNOSIS — F438 Other reactions to severe stress: Secondary | ICD-10-CM | POA: Diagnosis not present

## 2016-07-08 DIAGNOSIS — F411 Generalized anxiety disorder: Secondary | ICD-10-CM | POA: Diagnosis not present

## 2016-07-16 DIAGNOSIS — F438 Other reactions to severe stress: Secondary | ICD-10-CM | POA: Diagnosis not present

## 2016-07-22 DIAGNOSIS — F411 Generalized anxiety disorder: Secondary | ICD-10-CM | POA: Diagnosis not present

## 2016-07-30 DIAGNOSIS — F438 Other reactions to severe stress: Secondary | ICD-10-CM | POA: Diagnosis not present

## 2016-08-13 DIAGNOSIS — M26623 Arthralgia of bilateral temporomandibular joint: Secondary | ICD-10-CM | POA: Diagnosis not present

## 2016-08-13 DIAGNOSIS — J300 Vasomotor rhinitis: Secondary | ICD-10-CM | POA: Diagnosis not present

## 2016-09-23 DIAGNOSIS — F411 Generalized anxiety disorder: Secondary | ICD-10-CM | POA: Diagnosis not present

## 2016-09-30 DIAGNOSIS — F438 Other reactions to severe stress: Secondary | ICD-10-CM | POA: Diagnosis not present

## 2016-10-07 DIAGNOSIS — F411 Generalized anxiety disorder: Secondary | ICD-10-CM | POA: Diagnosis not present

## 2016-11-18 DIAGNOSIS — F411 Generalized anxiety disorder: Secondary | ICD-10-CM | POA: Diagnosis not present

## 2016-11-25 DIAGNOSIS — F4323 Adjustment disorder with mixed anxiety and depressed mood: Secondary | ICD-10-CM | POA: Diagnosis not present

## 2016-11-26 DIAGNOSIS — F411 Generalized anxiety disorder: Secondary | ICD-10-CM | POA: Diagnosis not present

## 2016-12-02 DIAGNOSIS — F411 Generalized anxiety disorder: Secondary | ICD-10-CM | POA: Diagnosis not present

## 2016-12-03 DIAGNOSIS — J209 Acute bronchitis, unspecified: Secondary | ICD-10-CM | POA: Diagnosis not present

## 2016-12-03 DIAGNOSIS — R05 Cough: Secondary | ICD-10-CM | POA: Diagnosis not present

## 2016-12-03 DIAGNOSIS — R6883 Chills (without fever): Secondary | ICD-10-CM | POA: Diagnosis not present

## 2016-12-16 DIAGNOSIS — F411 Generalized anxiety disorder: Secondary | ICD-10-CM | POA: Diagnosis not present

## 2016-12-18 DIAGNOSIS — F4323 Adjustment disorder with mixed anxiety and depressed mood: Secondary | ICD-10-CM | POA: Diagnosis not present

## 2016-12-23 DIAGNOSIS — F4323 Adjustment disorder with mixed anxiety and depressed mood: Secondary | ICD-10-CM | POA: Diagnosis not present

## 2016-12-30 DIAGNOSIS — F411 Generalized anxiety disorder: Secondary | ICD-10-CM | POA: Diagnosis not present

## 2017-01-09 DIAGNOSIS — F4323 Adjustment disorder with mixed anxiety and depressed mood: Secondary | ICD-10-CM | POA: Diagnosis not present

## 2017-01-29 DIAGNOSIS — F4323 Adjustment disorder with mixed anxiety and depressed mood: Secondary | ICD-10-CM | POA: Diagnosis not present

## 2017-02-05 DIAGNOSIS — F4323 Adjustment disorder with mixed anxiety and depressed mood: Secondary | ICD-10-CM | POA: Diagnosis not present

## 2017-02-10 DIAGNOSIS — F411 Generalized anxiety disorder: Secondary | ICD-10-CM | POA: Diagnosis not present

## 2017-02-12 DIAGNOSIS — F4323 Adjustment disorder with mixed anxiety and depressed mood: Secondary | ICD-10-CM | POA: Diagnosis not present

## 2017-02-19 DIAGNOSIS — F4323 Adjustment disorder with mixed anxiety and depressed mood: Secondary | ICD-10-CM | POA: Diagnosis not present

## 2017-02-25 DIAGNOSIS — F4323 Adjustment disorder with mixed anxiety and depressed mood: Secondary | ICD-10-CM | POA: Diagnosis not present

## 2017-03-03 DIAGNOSIS — F411 Generalized anxiety disorder: Secondary | ICD-10-CM | POA: Diagnosis not present

## 2017-03-04 DIAGNOSIS — F4323 Adjustment disorder with mixed anxiety and depressed mood: Secondary | ICD-10-CM | POA: Diagnosis not present

## 2017-03-12 DIAGNOSIS — F4323 Adjustment disorder with mixed anxiety and depressed mood: Secondary | ICD-10-CM | POA: Diagnosis not present

## 2017-03-17 DIAGNOSIS — F411 Generalized anxiety disorder: Secondary | ICD-10-CM | POA: Diagnosis not present

## 2017-03-18 DIAGNOSIS — Z1389 Encounter for screening for other disorder: Secondary | ICD-10-CM | POA: Diagnosis not present

## 2017-03-18 DIAGNOSIS — Z6841 Body Mass Index (BMI) 40.0 and over, adult: Secondary | ICD-10-CM | POA: Diagnosis not present

## 2017-03-18 DIAGNOSIS — R05 Cough: Secondary | ICD-10-CM | POA: Diagnosis not present

## 2017-03-18 DIAGNOSIS — J069 Acute upper respiratory infection, unspecified: Secondary | ICD-10-CM | POA: Diagnosis not present

## 2017-03-20 DIAGNOSIS — F4323 Adjustment disorder with mixed anxiety and depressed mood: Secondary | ICD-10-CM | POA: Diagnosis not present

## 2017-03-24 DIAGNOSIS — F4323 Adjustment disorder with mixed anxiety and depressed mood: Secondary | ICD-10-CM | POA: Diagnosis not present

## 2017-04-02 DIAGNOSIS — F411 Generalized anxiety disorder: Secondary | ICD-10-CM | POA: Diagnosis not present

## 2017-04-03 DIAGNOSIS — F4323 Adjustment disorder with mixed anxiety and depressed mood: Secondary | ICD-10-CM | POA: Diagnosis not present

## 2017-04-07 DIAGNOSIS — F411 Generalized anxiety disorder: Secondary | ICD-10-CM | POA: Diagnosis not present

## 2017-04-10 DIAGNOSIS — F4323 Adjustment disorder with mixed anxiety and depressed mood: Secondary | ICD-10-CM | POA: Diagnosis not present

## 2017-04-14 DIAGNOSIS — F411 Generalized anxiety disorder: Secondary | ICD-10-CM | POA: Diagnosis not present

## 2017-04-15 DIAGNOSIS — M25572 Pain in left ankle and joints of left foot: Secondary | ICD-10-CM | POA: Diagnosis not present

## 2017-04-17 DIAGNOSIS — F4323 Adjustment disorder with mixed anxiety and depressed mood: Secondary | ICD-10-CM | POA: Diagnosis not present

## 2017-04-24 DIAGNOSIS — F4323 Adjustment disorder with mixed anxiety and depressed mood: Secondary | ICD-10-CM | POA: Diagnosis not present

## 2017-04-28 DIAGNOSIS — F411 Generalized anxiety disorder: Secondary | ICD-10-CM | POA: Diagnosis not present

## 2017-04-29 DIAGNOSIS — M25572 Pain in left ankle and joints of left foot: Secondary | ICD-10-CM | POA: Diagnosis not present

## 2017-05-01 DIAGNOSIS — F4323 Adjustment disorder with mixed anxiety and depressed mood: Secondary | ICD-10-CM | POA: Diagnosis not present

## 2017-05-05 DIAGNOSIS — F411 Generalized anxiety disorder: Secondary | ICD-10-CM | POA: Diagnosis not present

## 2017-05-08 DIAGNOSIS — F4323 Adjustment disorder with mixed anxiety and depressed mood: Secondary | ICD-10-CM | POA: Diagnosis not present

## 2017-05-15 DIAGNOSIS — F4323 Adjustment disorder with mixed anxiety and depressed mood: Secondary | ICD-10-CM | POA: Diagnosis not present

## 2017-05-25 DIAGNOSIS — F411 Generalized anxiety disorder: Secondary | ICD-10-CM | POA: Diagnosis not present

## 2017-05-29 DIAGNOSIS — F4323 Adjustment disorder with mixed anxiety and depressed mood: Secondary | ICD-10-CM | POA: Diagnosis not present

## 2017-06-02 DIAGNOSIS — I10 Essential (primary) hypertension: Secondary | ICD-10-CM | POA: Diagnosis not present

## 2017-06-02 DIAGNOSIS — Z0000 Encounter for general adult medical examination without abnormal findings: Secondary | ICD-10-CM | POA: Diagnosis not present

## 2017-06-02 DIAGNOSIS — R82998 Other abnormal findings in urine: Secondary | ICD-10-CM | POA: Diagnosis not present

## 2017-06-02 DIAGNOSIS — E7849 Other hyperlipidemia: Secondary | ICD-10-CM | POA: Diagnosis not present

## 2017-06-02 DIAGNOSIS — E291 Testicular hypofunction: Secondary | ICD-10-CM | POA: Diagnosis not present

## 2017-06-02 DIAGNOSIS — F411 Generalized anxiety disorder: Secondary | ICD-10-CM | POA: Diagnosis not present

## 2017-06-05 DIAGNOSIS — F4323 Adjustment disorder with mixed anxiety and depressed mood: Secondary | ICD-10-CM | POA: Diagnosis not present

## 2017-06-09 DIAGNOSIS — Z1389 Encounter for screening for other disorder: Secondary | ICD-10-CM | POA: Diagnosis not present

## 2017-06-09 DIAGNOSIS — R7301 Impaired fasting glucose: Secondary | ICD-10-CM | POA: Diagnosis not present

## 2017-06-09 DIAGNOSIS — E7849 Other hyperlipidemia: Secondary | ICD-10-CM | POA: Diagnosis not present

## 2017-06-09 DIAGNOSIS — Z0000 Encounter for general adult medical examination without abnormal findings: Secondary | ICD-10-CM | POA: Diagnosis not present

## 2017-06-09 DIAGNOSIS — F4323 Adjustment disorder with mixed anxiety and depressed mood: Secondary | ICD-10-CM | POA: Diagnosis not present

## 2017-06-09 DIAGNOSIS — I10 Essential (primary) hypertension: Secondary | ICD-10-CM | POA: Diagnosis not present

## 2017-06-09 DIAGNOSIS — R946 Abnormal results of thyroid function studies: Secondary | ICD-10-CM | POA: Diagnosis not present

## 2017-06-12 DIAGNOSIS — F4323 Adjustment disorder with mixed anxiety and depressed mood: Secondary | ICD-10-CM | POA: Diagnosis not present

## 2017-06-17 DIAGNOSIS — F411 Generalized anxiety disorder: Secondary | ICD-10-CM | POA: Diagnosis not present

## 2017-06-19 DIAGNOSIS — F4323 Adjustment disorder with mixed anxiety and depressed mood: Secondary | ICD-10-CM | POA: Diagnosis not present

## 2017-06-26 DIAGNOSIS — F4323 Adjustment disorder with mixed anxiety and depressed mood: Secondary | ICD-10-CM | POA: Diagnosis not present

## 2017-06-28 DIAGNOSIS — H6122 Impacted cerumen, left ear: Secondary | ICD-10-CM | POA: Diagnosis not present

## 2017-06-28 DIAGNOSIS — H9209 Otalgia, unspecified ear: Secondary | ICD-10-CM | POA: Diagnosis not present

## 2017-06-29 DIAGNOSIS — H9042 Sensorineural hearing loss, unilateral, left ear, with unrestricted hearing on the contralateral side: Secondary | ICD-10-CM | POA: Diagnosis not present

## 2017-06-29 DIAGNOSIS — H6122 Impacted cerumen, left ear: Secondary | ICD-10-CM | POA: Diagnosis not present

## 2017-06-29 DIAGNOSIS — H9122 Sudden idiopathic hearing loss, left ear: Secondary | ICD-10-CM | POA: Diagnosis not present

## 2017-07-15 DIAGNOSIS — H9122 Sudden idiopathic hearing loss, left ear: Secondary | ICD-10-CM | POA: Diagnosis not present

## 2017-07-15 DIAGNOSIS — H9042 Sensorineural hearing loss, unilateral, left ear, with unrestricted hearing on the contralateral side: Secondary | ICD-10-CM | POA: Diagnosis not present

## 2017-07-28 DIAGNOSIS — F4323 Adjustment disorder with mixed anxiety and depressed mood: Secondary | ICD-10-CM | POA: Diagnosis not present

## 2017-07-28 DIAGNOSIS — R946 Abnormal results of thyroid function studies: Secondary | ICD-10-CM | POA: Diagnosis not present

## 2017-08-03 DIAGNOSIS — F411 Generalized anxiety disorder: Secondary | ICD-10-CM | POA: Diagnosis not present

## 2017-08-05 DIAGNOSIS — F4323 Adjustment disorder with mixed anxiety and depressed mood: Secondary | ICD-10-CM | POA: Diagnosis not present

## 2017-08-06 DIAGNOSIS — H9120 Sudden idiopathic hearing loss, unspecified ear: Secondary | ICD-10-CM | POA: Insufficient documentation

## 2017-09-03 DIAGNOSIS — F4323 Adjustment disorder with mixed anxiety and depressed mood: Secondary | ICD-10-CM | POA: Diagnosis not present

## 2017-09-09 DIAGNOSIS — F4323 Adjustment disorder with mixed anxiety and depressed mood: Secondary | ICD-10-CM | POA: Diagnosis not present

## 2017-09-16 DIAGNOSIS — F4323 Adjustment disorder with mixed anxiety and depressed mood: Secondary | ICD-10-CM | POA: Diagnosis not present

## 2017-09-23 DIAGNOSIS — F4323 Adjustment disorder with mixed anxiety and depressed mood: Secondary | ICD-10-CM | POA: Diagnosis not present

## 2017-09-30 DIAGNOSIS — F4323 Adjustment disorder with mixed anxiety and depressed mood: Secondary | ICD-10-CM | POA: Diagnosis not present

## 2017-10-08 DIAGNOSIS — F4323 Adjustment disorder with mixed anxiety and depressed mood: Secondary | ICD-10-CM | POA: Diagnosis not present

## 2017-10-16 DIAGNOSIS — F4323 Adjustment disorder with mixed anxiety and depressed mood: Secondary | ICD-10-CM | POA: Diagnosis not present

## 2017-10-23 DIAGNOSIS — F4323 Adjustment disorder with mixed anxiety and depressed mood: Secondary | ICD-10-CM | POA: Diagnosis not present

## 2017-11-05 DIAGNOSIS — F4323 Adjustment disorder with mixed anxiety and depressed mood: Secondary | ICD-10-CM | POA: Diagnosis not present

## 2017-11-09 ENCOUNTER — Ambulatory Visit (INDEPENDENT_AMBULATORY_CARE_PROVIDER_SITE_OTHER): Payer: BLUE CROSS/BLUE SHIELD | Admitting: Psychiatry

## 2017-11-09 DIAGNOSIS — F411 Generalized anxiety disorder: Secondary | ICD-10-CM

## 2017-11-09 NOTE — Progress Notes (Signed)
[facility] Counselor/Therapist Progress Note   Patient ID: Blake Ibarra, MRN: [id number]  Date: 11/09/2017  Timespent: 45 minutes  Treatment Type: Individual   Subjective: This is the first time the client has returned since mid July of this year.  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  His wife's full-time work has been positive for the family as a whole.  He states "she is working a lot."  He notes that there are great demands on her.  [place] lot of [place] which does not leave much time for teaching which frustrates his wife.  He reports that his youngest son is at a local elementary school which has been a huge positive for them.  They homeschooled him last year to hold him back one year.  This has worked out well at [employer]. Financially his wife working has been good.  He notes that on his salary alone they would not make it and because of that he feels guilty.  Today we focused on the idea of his wife working.  His negative cognition was"I am not making all the money."  He felt anxious and guilty in his chest.  His subjective units of distress started at a 5+.  At the end of the session it was down to a 1.  As the client used eye movement to process through these feelings and cognitions he remembered his wife yelling at him "you wanted me to work.  It is all your fault."  He asks himself the question, "have I pampered her too much?"  The client concluded that overall this has been a good thing for his family.  His 2 sons are doing well in school and for the most part his wife is enjoying her job. 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  He is going up for full professorship at [employer].  This will be a significant increase in his salary which will mitigate the loss from not teaching at [employer]. The client continues to see Dr. Altha Harm.  He and Dr. Ardath Sax are working  on his weight loss and how he responds to food.  Currently his anxiety is significantly under control and he feels emotionally he is doing very well.  We discussed the client cutting back and only seeing me once a month and then tapering off to an as needed schedule.  The client agreed to this.  Interventions:Solution Focused, Supportive and Other: EMDR  Mental Status Exam:   Appearance:   Casual     Behavior:  Appropriate  Motor:  Normal  Speech/Language:   Clear and Coherent  Affect:  Appropriate  Mood:  anxious  Thought process:  Coherent  Thought content:    Logical  Perceptual disturbances:    Normal  Orientation:  Full (Time, Place, and Person)  Attention:  Good  Concentration:  good  Memory:  Immediate  Fund of knowledge:   Good  Insight:    Good  Judgment:   Good  Impulse Control:  good    Reported Symptoms: Guilt, anxiety.  Risk Assessment: Danger to Self:  No Self-injurious Behavior: No Danger to Others: No Duty to Warn:no Physical Aggression / Violence:No  Access to Firearms a concern: No  Gang Involvement:No   Diagnosis:   ICD-10-CM   1. Generalized anxiety disorder F41.1  Plan: Boundaries, assertiveness, and self-care.  Blake Ibarra, [place]

## 2017-11-18 DIAGNOSIS — F4323 Adjustment disorder with mixed anxiety and depressed mood: Secondary | ICD-10-CM | POA: Diagnosis not present

## 2017-12-16 DIAGNOSIS — F4323 Adjustment disorder with mixed anxiety and depressed mood: Secondary | ICD-10-CM | POA: Diagnosis not present

## 2017-12-21 ENCOUNTER — Ambulatory Visit: Payer: BLUE CROSS/BLUE SHIELD | Admitting: Psychiatry

## 2017-12-23 DIAGNOSIS — F4323 Adjustment disorder with mixed anxiety and depressed mood: Secondary | ICD-10-CM | POA: Diagnosis not present

## 2018-01-06 DIAGNOSIS — F4323 Adjustment disorder with mixed anxiety and depressed mood: Secondary | ICD-10-CM | POA: Diagnosis not present

## 2018-01-26 DIAGNOSIS — F4323 Adjustment disorder with mixed anxiety and depressed mood: Secondary | ICD-10-CM | POA: Diagnosis not present

## 2018-01-28 DIAGNOSIS — H9120 Sudden idiopathic hearing loss, unspecified ear: Secondary | ICD-10-CM | POA: Diagnosis not present

## 2018-02-02 DIAGNOSIS — F4323 Adjustment disorder with mixed anxiety and depressed mood: Secondary | ICD-10-CM | POA: Diagnosis not present

## 2018-02-04 ENCOUNTER — Ambulatory Visit: Payer: BLUE CROSS/BLUE SHIELD | Admitting: Psychiatry

## 2018-02-09 DIAGNOSIS — F4323 Adjustment disorder with mixed anxiety and depressed mood: Secondary | ICD-10-CM | POA: Diagnosis not present

## 2018-02-23 DIAGNOSIS — F4323 Adjustment disorder with mixed anxiety and depressed mood: Secondary | ICD-10-CM | POA: Diagnosis not present

## 2018-03-02 DIAGNOSIS — F4323 Adjustment disorder with mixed anxiety and depressed mood: Secondary | ICD-10-CM | POA: Diagnosis not present

## 2018-03-09 DIAGNOSIS — F4323 Adjustment disorder with mixed anxiety and depressed mood: Secondary | ICD-10-CM | POA: Diagnosis not present

## 2018-03-11 ENCOUNTER — Ambulatory Visit: Payer: BLUE CROSS/BLUE SHIELD | Admitting: Psychiatry

## 2018-03-16 DIAGNOSIS — F4323 Adjustment disorder with mixed anxiety and depressed mood: Secondary | ICD-10-CM | POA: Diagnosis not present

## 2018-04-06 ENCOUNTER — Telehealth: Payer: Self-pay | Admitting: Cardiovascular Disease

## 2018-04-06 DIAGNOSIS — R7301 Impaired fasting glucose: Secondary | ICD-10-CM | POA: Diagnosis not present

## 2018-04-06 DIAGNOSIS — R079 Chest pain, unspecified: Secondary | ICD-10-CM | POA: Diagnosis not present

## 2018-04-06 DIAGNOSIS — E7849 Other hyperlipidemia: Secondary | ICD-10-CM | POA: Diagnosis not present

## 2018-04-06 DIAGNOSIS — I10 Essential (primary) hypertension: Secondary | ICD-10-CM | POA: Diagnosis not present

## 2018-04-06 DIAGNOSIS — F4323 Adjustment disorder with mixed anxiety and depressed mood: Secondary | ICD-10-CM | POA: Diagnosis not present

## 2018-04-06 NOTE — Telephone Encounter (Signed)
Called patient and LVM regarding appointment with Dr. Gwenlyn Found on Friday, April 09, 2018.

## 2018-04-09 ENCOUNTER — Encounter: Payer: Self-pay | Admitting: Cardiovascular Disease

## 2018-04-09 ENCOUNTER — Ambulatory Visit (INDEPENDENT_AMBULATORY_CARE_PROVIDER_SITE_OTHER): Payer: BLUE CROSS/BLUE SHIELD | Admitting: Cardiovascular Disease

## 2018-04-09 VITALS — BP 146/88 | HR 56 | Ht 67.0 in | Wt 250.4 lb

## 2018-04-09 DIAGNOSIS — R0789 Other chest pain: Secondary | ICD-10-CM | POA: Insufficient documentation

## 2018-04-09 DIAGNOSIS — I10 Essential (primary) hypertension: Secondary | ICD-10-CM | POA: Insufficient documentation

## 2018-04-09 DIAGNOSIS — R072 Precordial pain: Secondary | ICD-10-CM | POA: Diagnosis not present

## 2018-04-09 DIAGNOSIS — F419 Anxiety disorder, unspecified: Secondary | ICD-10-CM | POA: Insufficient documentation

## 2018-04-09 NOTE — Progress Notes (Signed)
04/09/2018 Blake Ibarra   04-20-1970  [id number]  Primary Physician Marton Redwood, MD Primary Cardiologist: Lorretta Harp MD Lupe Carney, [place]  HPI:  Blake Ibarra is a 48 y.o. severely overweight married Caucasian male father of 2 young children who works as a Curator at [employer].  He was referred by Dr. Lang Snow for cardiovascular valuation because of atypical chest pain.  His only risk factor is treated hypertension.  There is a question of mitral valve prolapse in the past.  He exercises on occasion with a trainer is asymptomatic during that time.  There is no family history for heart disease.  He is never had a heart attack or stroke.  He gets chest pressure during times of stress.   Current Meds  Medication Sig  . amLODipine (NORVASC) 5 MG tablet Take 5 mg by mouth daily.  . Cholecalciferol (VITAMIN D-3) 5000 UNITS TABS Take by mouth daily.  Marland Kitchen lisinopril (PRINIVIL,ZESTRIL) 40 MG tablet Take 40 mg by mouth daily.  . Omega-3 Fatty Acids (FISH OIL) 1200 MG CAPS Take by mouth 2 (two) times daily.  . RABEprazole (ACIPHEX) 20 MG tablet Take 20 mg by mouth daily.  . Testosterone (AXIRON TD) Place onto the skin daily.     No Known Allergies  Social History   Socioeconomic History  . Marital status: Married    Spouse name: Not on file  . Number of children: Not on file  . Years of education: Not on file  . Highest education level: Not on file  Occupational History  . Not on file  Social Needs  . Financial resource strain: Not on file  . Food insecurity:    Worry: Not on file    Inability: Not on file  . Transportation needs:    Medical: Not on file    Non-medical: Not on file  Tobacco Use  . Smoking status: Never Smoker  . Smokeless tobacco: Never Used  Substance and Sexual Activity  . Alcohol use: Yes    Comment: occasional  . Drug use: No  . Sexual activity: Not on file  Lifestyle  . Physical activity:    Days per week: Not on file   Minutes per session: Not on file  . Stress: Not on file  Relationships  . Social connections:    Talks on phone: Not on file    Gets together: Not on file    Attends religious service: Not on file    Active member of club or organization: Not on file    Attends meetings of clubs or organizations: Not on file    Relationship status: Not on file  . Intimate partner violence:    Fear of current or ex partner: Not on file    Emotionally abused: Not on file    Physically abused: Not on file    Forced sexual activity: Not on file  Other Topics Concern  . Not on file  Social History Narrative  . Not on file     Review of Systems: General: negative for chills, fever, night sweats or weight changes.  Cardiovascular: negative for chest pain, dyspnea on exertion, edema, orthopnea, palpitations, paroxysmal nocturnal dyspnea or shortness of breath Dermatological: negative for rash Respiratory: negative for cough or wheezing Urologic: negative for hematuria Abdominal: negative for nausea, vomiting, diarrhea, bright red blood per rectum, melena, or hematemesis Neurologic: negative for visual changes, syncope, or dizziness All other systems reviewed and are otherwise negative except  as noted above.    Blood pressure (!) 146/88, pulse (!) 56, height 5\' 7"  (1.702 m), weight 250 lb 6.4 oz (113.6 kg).  General appearance: alert and no distress Neck: no adenopathy, no carotid bruit, no JVD, supple, symmetrical, trachea midline and thyroid not enlarged, symmetric, no tenderness/mass/nodules Lungs: clear to auscultation bilaterally Heart: regular rate and rhythm, S1, S2 normal, no murmur, click, rub or gallop Extremities: extremities normal, atraumatic, no cyanosis or edema Pulses: 2+ and symmetric Skin: Skin color, texture, turgor normal. No rashes or lesions Neurologic: Alert and oriented X 3, normal strength and tone. Normal symmetric reflexes. Normal coordination and gait  EKG sinus  bradycardia at 56 with LVH voltage.  I personally reviewed this EKG.  ASSESSMENT AND PLAN:   Essential hypertension History of essential hypertension with blood pressure measured today 146/88.  He is on lisinopril and amlodipine.  Atypical chest pain [name] is a 48 year old gentleman referred by Dr. Brigitte Pulse for atypical chest pain.  He has had a long history of anxiety disorder and chest pain related to this.  His only risk factor for ischemic heart disease is treated hypertension.  I have a low suspicion that his chest pain is ischemically mediated.  I am going to get a coronary calcium score to further evaluate      Lorretta Harp MD [facility], [facility] 04/09/2018 11:20 AM

## 2018-04-09 NOTE — Assessment & Plan Note (Signed)
Blake Ibarra is a 48 year old gentleman referred by Dr. Brigitte Pulse for atypical chest pain.  He has had a long history of anxiety disorder and chest pain related to this.  His only risk factor for ischemic heart disease is treated hypertension.  I have a low suspicion that his chest pain is ischemically mediated.  I am going to get a coronary calcium score to further evaluate

## 2018-04-09 NOTE — Patient Instructions (Signed)
Medication Instructions:  NO CHANGE If you need a refill on your cardiac medications before your next appointment, please call your pharmacy.   Lab work: If you have labs (blood work) drawn today and your tests are completely normal, you will receive your results only by: Marland Kitchen MyChart Message (if you have MyChart) OR . A paper copy in the mail If you have any lab test that is abnormal or we need to change your treatment, we will call you to review the results.  Testing/Procedures: CORONARY CALCIUM SCORE CT=[address]  Follow-Up: At [facility], you and your health needs are our priority.  As part of our continuing mission to provide you with exceptional heart care, we have created designated Provider Care Teams.  These Care Teams include your primary Cardiologist (physician) and Advanced Practice Providers (APPs -  Physician Assistants and Nurse Practitioners) who all work together to provide you with the care you need, when you need it. Your physician recommends that you schedule a follow-up appointment in:  AS NEEDED PENDING TEST RESULTS

## 2018-04-09 NOTE — Assessment & Plan Note (Signed)
History of essential hypertension with blood pressure measured today 146/88.  He is on lisinopril and amlodipine.

## 2018-04-10 DIAGNOSIS — F4323 Adjustment disorder with mixed anxiety and depressed mood: Secondary | ICD-10-CM | POA: Diagnosis not present

## 2018-04-13 DIAGNOSIS — F4323 Adjustment disorder with mixed anxiety and depressed mood: Secondary | ICD-10-CM | POA: Diagnosis not present

## 2018-04-14 DIAGNOSIS — F4323 Adjustment disorder with mixed anxiety and depressed mood: Secondary | ICD-10-CM | POA: Diagnosis not present

## 2018-04-23 DIAGNOSIS — F4323 Adjustment disorder with mixed anxiety and depressed mood: Secondary | ICD-10-CM | POA: Diagnosis not present

## 2018-05-07 DIAGNOSIS — F4323 Adjustment disorder with mixed anxiety and depressed mood: Secondary | ICD-10-CM | POA: Diagnosis not present

## 2018-05-21 DIAGNOSIS — F4323 Adjustment disorder with mixed anxiety and depressed mood: Secondary | ICD-10-CM | POA: Diagnosis not present

## 2018-05-24 ENCOUNTER — Other Ambulatory Visit: Payer: BLUE CROSS/BLUE SHIELD

## 2018-06-09 DIAGNOSIS — Z125 Encounter for screening for malignant neoplasm of prostate: Secondary | ICD-10-CM | POA: Diagnosis not present

## 2018-06-09 DIAGNOSIS — I10 Essential (primary) hypertension: Secondary | ICD-10-CM | POA: Diagnosis not present

## 2018-06-09 DIAGNOSIS — R946 Abnormal results of thyroid function studies: Secondary | ICD-10-CM | POA: Diagnosis not present

## 2018-06-09 DIAGNOSIS — E8881 Metabolic syndrome: Secondary | ICD-10-CM | POA: Diagnosis not present

## 2018-06-09 DIAGNOSIS — E291 Testicular hypofunction: Secondary | ICD-10-CM | POA: Diagnosis not present

## 2018-06-09 DIAGNOSIS — Z0000 Encounter for general adult medical examination without abnormal findings: Secondary | ICD-10-CM | POA: Diagnosis not present

## 2018-06-09 DIAGNOSIS — R82998 Other abnormal findings in urine: Secondary | ICD-10-CM | POA: Diagnosis not present

## 2018-06-09 DIAGNOSIS — E7849 Other hyperlipidemia: Secondary | ICD-10-CM | POA: Diagnosis not present

## 2018-06-11 DIAGNOSIS — F4323 Adjustment disorder with mixed anxiety and depressed mood: Secondary | ICD-10-CM | POA: Diagnosis not present

## 2018-06-16 DIAGNOSIS — I10 Essential (primary) hypertension: Secondary | ICD-10-CM | POA: Diagnosis not present

## 2018-06-16 DIAGNOSIS — E785 Hyperlipidemia, unspecified: Secondary | ICD-10-CM | POA: Diagnosis not present

## 2018-06-16 DIAGNOSIS — E8881 Metabolic syndrome: Secondary | ICD-10-CM | POA: Diagnosis not present

## 2018-06-16 DIAGNOSIS — R7301 Impaired fasting glucose: Secondary | ICD-10-CM | POA: Diagnosis not present

## 2018-06-16 DIAGNOSIS — R079 Chest pain, unspecified: Secondary | ICD-10-CM | POA: Diagnosis not present

## 2018-06-16 DIAGNOSIS — Z0000 Encounter for general adult medical examination without abnormal findings: Secondary | ICD-10-CM | POA: Diagnosis not present

## 2018-06-29 ENCOUNTER — Ambulatory Visit (INDEPENDENT_AMBULATORY_CARE_PROVIDER_SITE_OTHER)
Admission: RE | Admit: 2018-06-29 | Discharge: 2018-06-29 | Disposition: A | Discharge status: Home / Self Care | Payer: Self-pay | Source: Ambulatory Visit | Attending: Cardiovascular Disease | Admitting: Cardiovascular Disease

## 2018-06-29 ENCOUNTER — Other Ambulatory Visit: Payer: Self-pay

## 2018-06-29 ENCOUNTER — Telehealth: Payer: Self-pay | Admitting: Cardiovascular Disease

## 2018-06-29 DIAGNOSIS — R072 Precordial pain: Secondary | ICD-10-CM

## 2018-06-29 NOTE — Telephone Encounter (Signed)
Result note from MD pending. Routed to nurse to f/up with patient

## 2018-06-29 NOTE — Telephone Encounter (Signed)
New Message    Patient calling for CT results.

## 2018-07-01 NOTE — Telephone Encounter (Signed)
Follow up:    Patient calling for CT results

## 2018-07-02 NOTE — Telephone Encounter (Signed)
Notes recorded by Lorretta Harp, MD on 06/30/2018 at 12:56 PM EDT  Coronary calcium score was 0

## 2018-07-02 NOTE — Telephone Encounter (Signed)
Patient called w/results °

## 2018-07-09 ENCOUNTER — Other Ambulatory Visit: Payer: BLUE CROSS/BLUE SHIELD

## 2018-07-16 DIAGNOSIS — F4323 Adjustment disorder with mixed anxiety and depressed mood: Secondary | ICD-10-CM | POA: Diagnosis not present

## 2018-07-23 DIAGNOSIS — F4323 Adjustment disorder with mixed anxiety and depressed mood: Secondary | ICD-10-CM | POA: Diagnosis not present

## 2018-08-05 DIAGNOSIS — F4323 Adjustment disorder with mixed anxiety and depressed mood: Secondary | ICD-10-CM | POA: Diagnosis not present

## 2018-08-30 ENCOUNTER — Other Ambulatory Visit: Payer: BLUE CROSS/BLUE SHIELD

## 2018-09-02 DIAGNOSIS — Z09 Encounter for follow-up examination after completed treatment for conditions other than malignant neoplasm: Secondary | ICD-10-CM | POA: Diagnosis not present

## 2018-09-02 DIAGNOSIS — Z8669 Personal history of other diseases of the nervous system and sense organs: Secondary | ICD-10-CM | POA: Diagnosis not present

## 2018-09-02 DIAGNOSIS — H9122 Sudden idiopathic hearing loss, left ear: Secondary | ICD-10-CM | POA: Diagnosis not present

## 2018-09-09 DIAGNOSIS — F4323 Adjustment disorder with mixed anxiety and depressed mood: Secondary | ICD-10-CM | POA: Diagnosis not present

## 2018-09-28 DIAGNOSIS — F4320 Adjustment disorder, unspecified: Secondary | ICD-10-CM | POA: Diagnosis not present

## 2018-09-29 DIAGNOSIS — F4323 Adjustment disorder with mixed anxiety and depressed mood: Secondary | ICD-10-CM | POA: Diagnosis not present

## 2018-10-07 DIAGNOSIS — F4323 Adjustment disorder with mixed anxiety and depressed mood: Secondary | ICD-10-CM | POA: Diagnosis not present

## 2018-10-14 DIAGNOSIS — F4323 Adjustment disorder with mixed anxiety and depressed mood: Secondary | ICD-10-CM | POA: Diagnosis not present

## 2018-10-28 DIAGNOSIS — F4320 Adjustment disorder, unspecified: Secondary | ICD-10-CM | POA: Diagnosis not present

## 2018-12-02 DIAGNOSIS — F4320 Adjustment disorder, unspecified: Secondary | ICD-10-CM | POA: Diagnosis not present

## 2018-12-09 DIAGNOSIS — F4320 Adjustment disorder, unspecified: Secondary | ICD-10-CM | POA: Diagnosis not present

## 2018-12-22 DIAGNOSIS — F4320 Adjustment disorder, unspecified: Secondary | ICD-10-CM | POA: Diagnosis not present

## 2018-12-28 DIAGNOSIS — F4320 Adjustment disorder, unspecified: Secondary | ICD-10-CM | POA: Diagnosis not present

## 2019-01-06 DIAGNOSIS — F4320 Adjustment disorder, unspecified: Secondary | ICD-10-CM | POA: Diagnosis not present

## 2019-01-12 DIAGNOSIS — F4320 Adjustment disorder, unspecified: Secondary | ICD-10-CM | POA: Diagnosis not present

## 2019-01-27 DIAGNOSIS — F4320 Adjustment disorder, unspecified: Secondary | ICD-10-CM | POA: Diagnosis not present

## 2019-02-03 DIAGNOSIS — F4320 Adjustment disorder, unspecified: Secondary | ICD-10-CM | POA: Diagnosis not present

## 2019-02-10 DIAGNOSIS — F4320 Adjustment disorder, unspecified: Secondary | ICD-10-CM | POA: Diagnosis not present

## 2019-02-21 DIAGNOSIS — H6123 Impacted cerumen, bilateral: Secondary | ICD-10-CM | POA: Diagnosis not present

## 2019-02-21 DIAGNOSIS — R200 Anesthesia of skin: Secondary | ICD-10-CM | POA: Diagnosis not present

## 2019-02-21 DIAGNOSIS — R0981 Nasal congestion: Secondary | ICD-10-CM | POA: Diagnosis not present

## 2019-02-24 DIAGNOSIS — F4320 Adjustment disorder, unspecified: Secondary | ICD-10-CM | POA: Diagnosis not present

## 2019-06-14 DIAGNOSIS — Z1212 Encounter for screening for malignant neoplasm of rectum: Secondary | ICD-10-CM | POA: Diagnosis not present

## 2019-06-14 DIAGNOSIS — I10 Essential (primary) hypertension: Secondary | ICD-10-CM | POA: Diagnosis not present

## 2019-06-14 DIAGNOSIS — R7301 Impaired fasting glucose: Secondary | ICD-10-CM | POA: Diagnosis not present

## 2019-06-14 DIAGNOSIS — Z0000 Encounter for general adult medical examination without abnormal findings: Secondary | ICD-10-CM | POA: Diagnosis not present

## 2019-06-14 DIAGNOSIS — R82998 Other abnormal findings in urine: Secondary | ICD-10-CM | POA: Diagnosis not present

## 2019-06-14 DIAGNOSIS — E7849 Other hyperlipidemia: Secondary | ICD-10-CM | POA: Diagnosis not present

## 2019-06-21 DIAGNOSIS — E7849 Other hyperlipidemia: Secondary | ICD-10-CM | POA: Diagnosis not present

## 2019-06-21 DIAGNOSIS — E291 Testicular hypofunction: Secondary | ICD-10-CM | POA: Diagnosis not present

## 2019-06-21 DIAGNOSIS — Z0000 Encounter for general adult medical examination without abnormal findings: Secondary | ICD-10-CM | POA: Diagnosis not present

## 2019-06-21 DIAGNOSIS — I10 Essential (primary) hypertension: Secondary | ICD-10-CM | POA: Diagnosis not present

## 2019-06-21 DIAGNOSIS — E8881 Metabolic syndrome: Secondary | ICD-10-CM | POA: Diagnosis not present

## 2019-08-28 IMAGING — CT CT HEART SCORING
2 series · 16 of 20 positions shown, 18 images · non-contrast
Comparison: None.
COMPARISON: None.

Addendum:
EXAM:
OVER-READ INTERPRETATION  CT CHEST

The following report is an over-read performed by radiologist Dr.
Brahmachari Nya Zaky [REDACTED] on 06/29/2018. This over-read
does not include interpretation of cardiac or coronary anatomy or
pathology. The calcium score interpretation by the cardiologist is
attached.
CLINICAL DATA: Risk stratification
Coronary Calcium Score
TECHNIQUE: The patient was scanned on a Siemens Somatom 64 slice scanner. Axial
non-contrast 3 mm slices were carried out through the heart. The
data set was analyzed on a dedicated work station and scored using
the Agatson method.

[Series 2: casc 3.0 i36f 2 bestdiast 71 % · axial · 0.43mm/px · z∈[-193,-112]mm · 8 of 35 slices shown, 10 images]
[im 4/35  vessel]
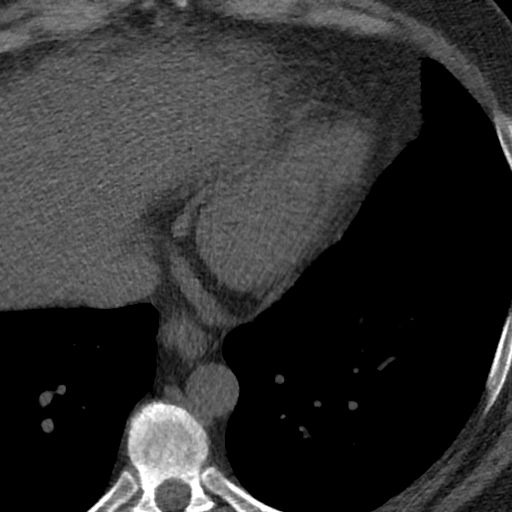
[im 4/35  lung]
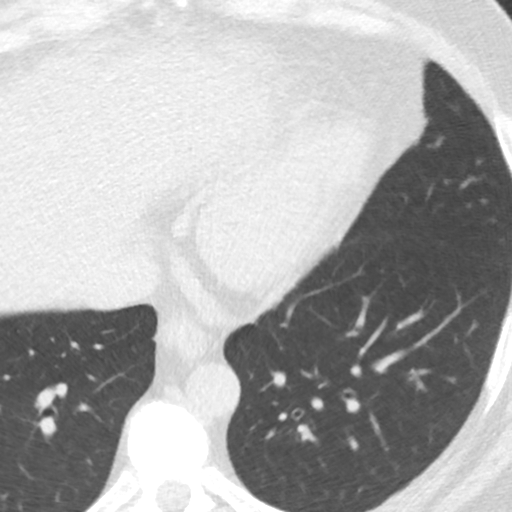
[im 8/35  vessel]
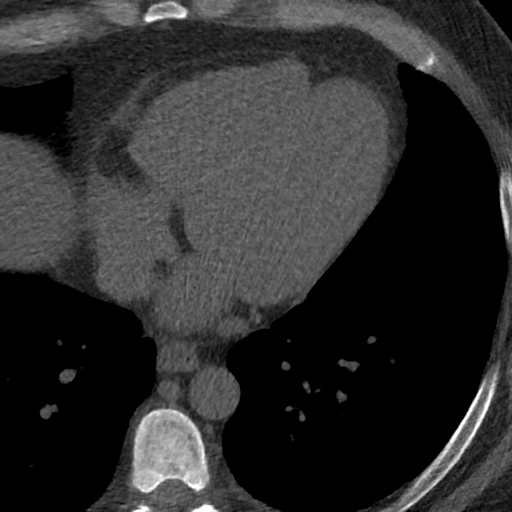
[im 12/35  vessel]
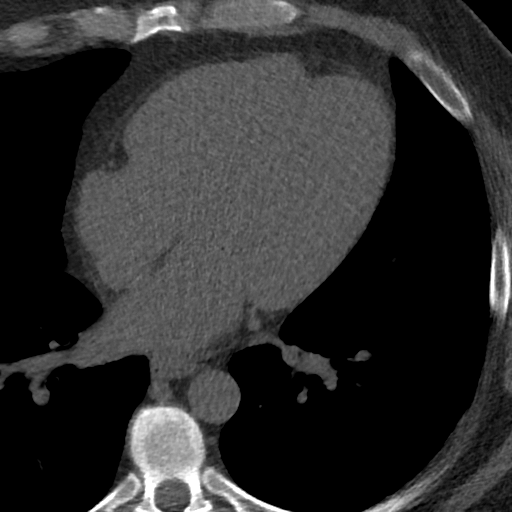
[im 16/35  vessel]
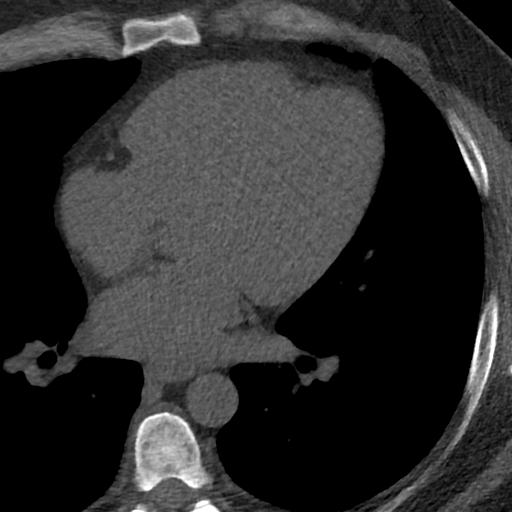
[im 19/35  vessel]
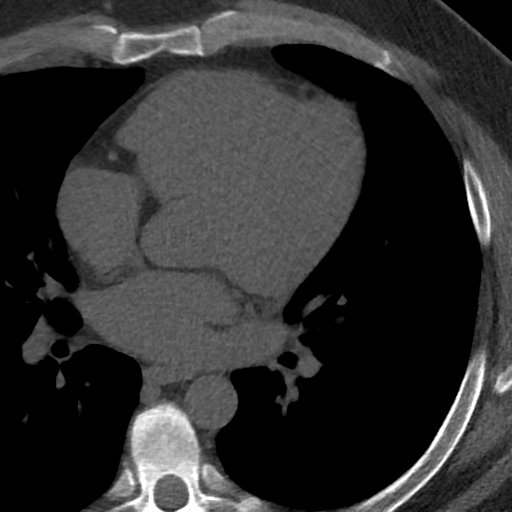
[im 19/35  lung]
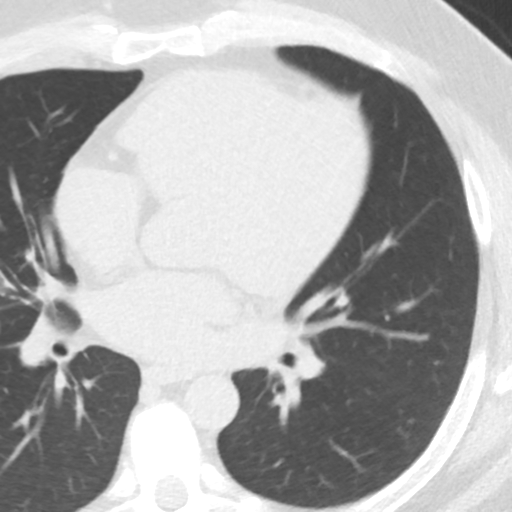
[im 23/35  vessel]
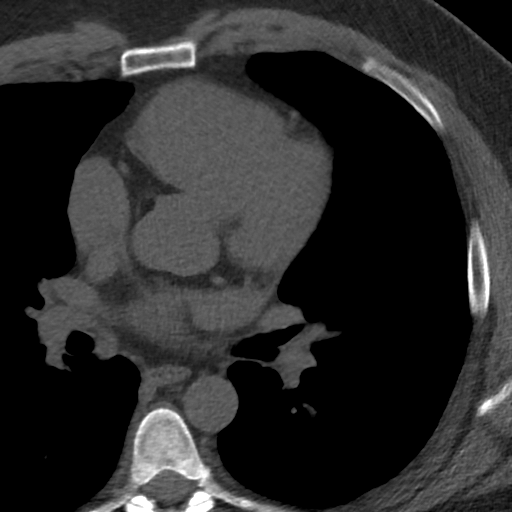
[im 27/35  vessel]
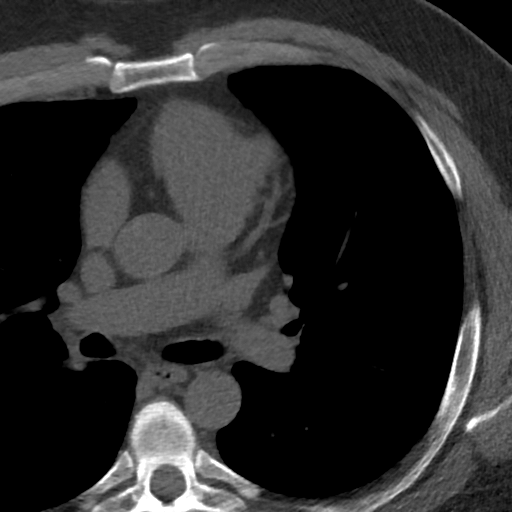
[im 31/35  vessel]
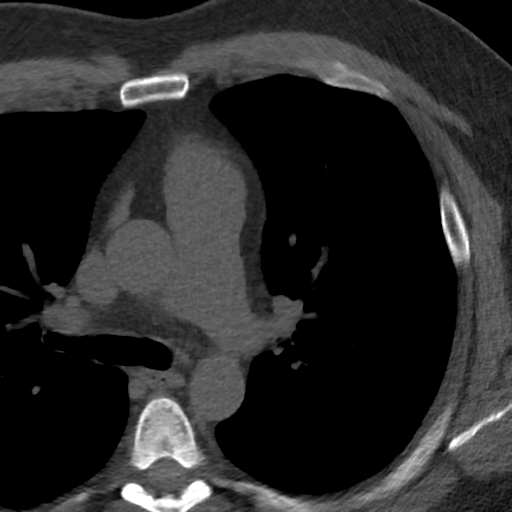

[Series 4: lung st 71 % · axial · 0.79mm/px · z∈[-192,-112]mm · 8 of 35 slices shown]
[im 4/35  lung]
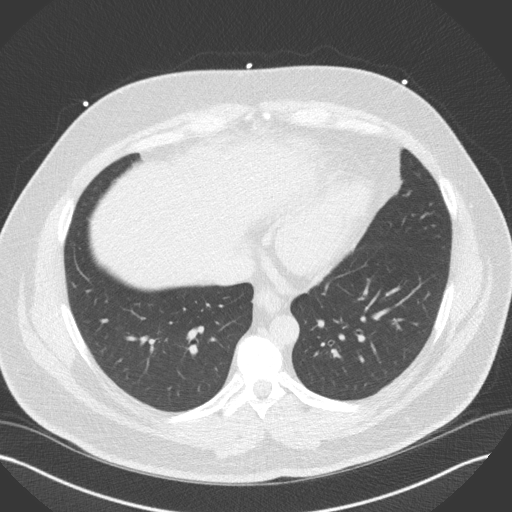
[im 8/35  lung]
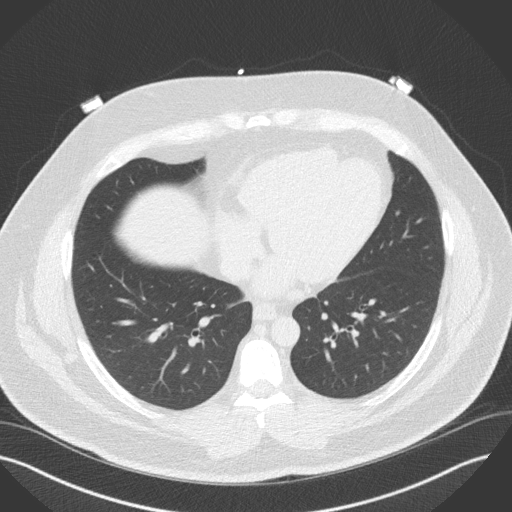
[im 12/35  lung]
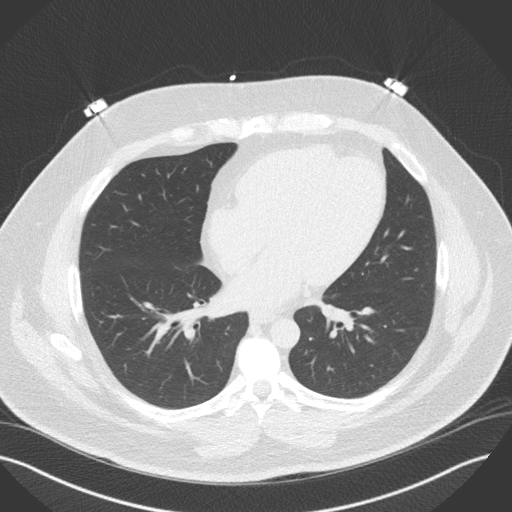
[im 16/35  lung]
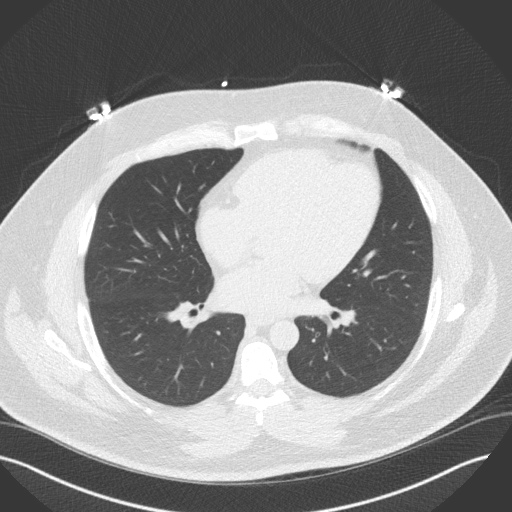
[im 19/35  lung]
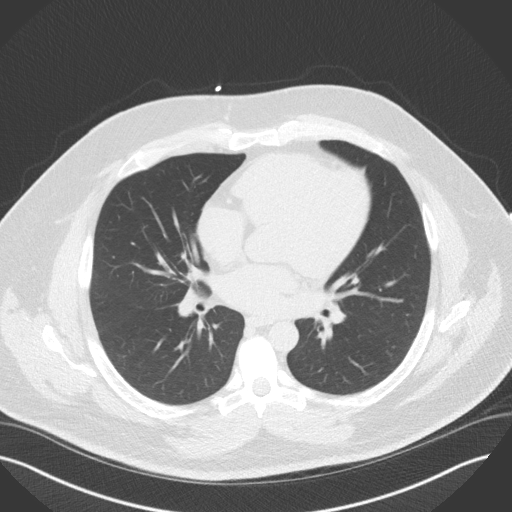
[im 23/35  lung]
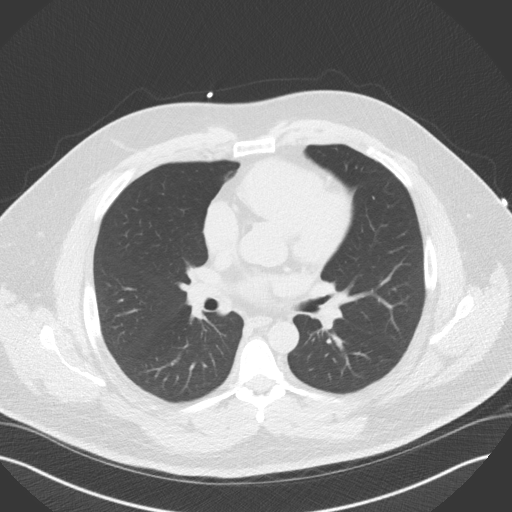
[im 27/35  lung]
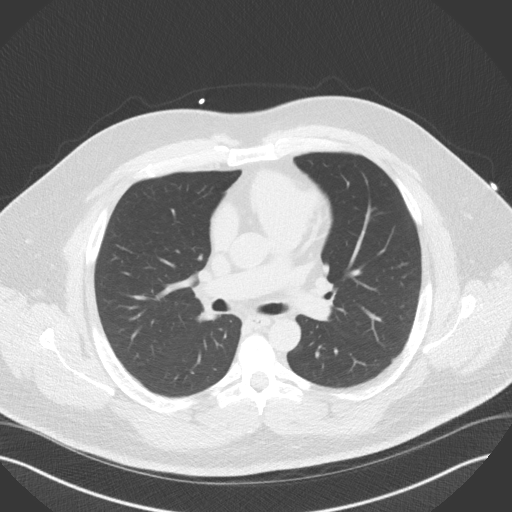
[im 31/35  lung]
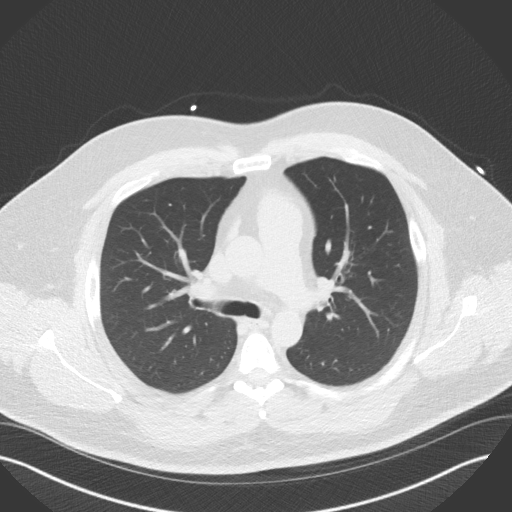

[16 of 20 positions shown; findings below may reference images not displayed]

FINDINGS: Vascular: Normal aortic caliber.

Mediastinum/Nodes: No imaged thoracic adenopathy.

Lungs/Pleura: No imaged pleural fluid.  Clear imaged lungs.

Upper Abdomen: Normal imaged portions of the liver.

Musculoskeletal: No acute osseous abnormality.
IMPRESSION: No acute findings in the imaged extracardiac chest.
FINDINGS: Non-cardiac: See separate report from [REDACTED].

Ascending aorta: Normal diameter

Pericardium: Normal

Coronary arteries: No calcium noted
IMPRESSION: Coronary calcium score of 0.

Jarvin Sj

*** End of Addendum ***
EXAM:
OVER-READ INTERPRETATION  CT CHEST

The following report is an over-read performed by radiologist Dr.
Brahmachari Nya Zaky [REDACTED] on 06/29/2018. This over-read
does not include interpretation of cardiac or coronary anatomy or
pathology. The calcium score interpretation by the cardiologist is
attached.
FINDINGS: Vascular: Normal aortic caliber.

Mediastinum/Nodes: No imaged thoracic adenopathy.

Lungs/Pleura: No imaged pleural fluid.  Clear imaged lungs.

Upper Abdomen: Normal imaged portions of the liver.

Musculoskeletal: No acute osseous abnormality.
IMPRESSION: No acute findings in the imaged extracardiac chest.

## 2019-09-01 DIAGNOSIS — H93292 Other abnormal auditory perceptions, left ear: Secondary | ICD-10-CM | POA: Diagnosis not present

## 2019-09-01 DIAGNOSIS — H6122 Impacted cerumen, left ear: Secondary | ICD-10-CM | POA: Diagnosis not present

## 2019-09-01 DIAGNOSIS — H938X1 Other specified disorders of right ear: Secondary | ICD-10-CM | POA: Diagnosis not present

## 2019-09-01 DIAGNOSIS — Z0110 Encounter for examination of ears and hearing without abnormal findings: Secondary | ICD-10-CM | POA: Diagnosis not present

## 2019-11-16 DIAGNOSIS — R059 Cough, unspecified: Secondary | ICD-10-CM | POA: Diagnosis not present

## 2019-11-16 DIAGNOSIS — Z1152 Encounter for screening for COVID-19: Secondary | ICD-10-CM | POA: Diagnosis not present

## 2020-06-19 DIAGNOSIS — Z125 Encounter for screening for malignant neoplasm of prostate: Secondary | ICD-10-CM | POA: Diagnosis not present

## 2020-06-19 DIAGNOSIS — E785 Hyperlipidemia, unspecified: Secondary | ICD-10-CM | POA: Diagnosis not present

## 2020-06-19 DIAGNOSIS — R82998 Other abnormal findings in urine: Secondary | ICD-10-CM | POA: Diagnosis not present

## 2020-06-19 DIAGNOSIS — E291 Testicular hypofunction: Secondary | ICD-10-CM | POA: Diagnosis not present

## 2020-06-19 DIAGNOSIS — R7301 Impaired fasting glucose: Secondary | ICD-10-CM | POA: Diagnosis not present

## 2020-06-20 DIAGNOSIS — Z1212 Encounter for screening for malignant neoplasm of rectum: Secondary | ICD-10-CM | POA: Diagnosis not present

## 2020-06-25 DIAGNOSIS — E291 Testicular hypofunction: Secondary | ICD-10-CM | POA: Diagnosis not present

## 2020-06-25 DIAGNOSIS — Z1339 Encounter for screening examination for other mental health and behavioral disorders: Secondary | ICD-10-CM | POA: Diagnosis not present

## 2020-06-25 DIAGNOSIS — I10 Essential (primary) hypertension: Secondary | ICD-10-CM | POA: Diagnosis not present

## 2020-06-25 DIAGNOSIS — Z1331 Encounter for screening for depression: Secondary | ICD-10-CM | POA: Diagnosis not present

## 2020-06-25 DIAGNOSIS — Z0000 Encounter for general adult medical examination without abnormal findings: Secondary | ICD-10-CM | POA: Diagnosis not present

## 2020-07-17 DIAGNOSIS — Z03818 Encounter for observation for suspected exposure to other biological agents ruled out: Secondary | ICD-10-CM | POA: Diagnosis not present

## 2020-07-17 DIAGNOSIS — Z20828 Contact with and (suspected) exposure to other viral communicable diseases: Secondary | ICD-10-CM | POA: Diagnosis not present

## 2020-07-18 DIAGNOSIS — Z20828 Contact with and (suspected) exposure to other viral communicable diseases: Secondary | ICD-10-CM | POA: Diagnosis not present

## 2020-07-18 DIAGNOSIS — Z03818 Encounter for observation for suspected exposure to other biological agents ruled out: Secondary | ICD-10-CM | POA: Diagnosis not present

## 2020-07-19 DIAGNOSIS — Z20828 Contact with and (suspected) exposure to other viral communicable diseases: Secondary | ICD-10-CM | POA: Diagnosis not present

## 2020-07-19 DIAGNOSIS — Z03818 Encounter for observation for suspected exposure to other biological agents ruled out: Secondary | ICD-10-CM | POA: Diagnosis not present

## 2020-07-20 DIAGNOSIS — Z20828 Contact with and (suspected) exposure to other viral communicable diseases: Secondary | ICD-10-CM | POA: Diagnosis not present

## 2020-07-20 DIAGNOSIS — Z03818 Encounter for observation for suspected exposure to other biological agents ruled out: Secondary | ICD-10-CM | POA: Diagnosis not present

## 2020-07-23 DIAGNOSIS — Z03818 Encounter for observation for suspected exposure to other biological agents ruled out: Secondary | ICD-10-CM | POA: Diagnosis not present

## 2020-07-23 DIAGNOSIS — Z20828 Contact with and (suspected) exposure to other viral communicable diseases: Secondary | ICD-10-CM | POA: Diagnosis not present

## 2020-08-03 DIAGNOSIS — R059 Cough, unspecified: Secondary | ICD-10-CM | POA: Diagnosis not present

## 2020-08-03 DIAGNOSIS — Z6841 Body Mass Index (BMI) 40.0 and over, adult: Secondary | ICD-10-CM | POA: Diagnosis not present

## 2020-08-03 DIAGNOSIS — J069 Acute upper respiratory infection, unspecified: Secondary | ICD-10-CM | POA: Diagnosis not present

## 2020-08-03 DIAGNOSIS — G4733 Obstructive sleep apnea (adult) (pediatric): Secondary | ICD-10-CM | POA: Diagnosis not present

## 2020-08-03 DIAGNOSIS — J029 Acute pharyngitis, unspecified: Secondary | ICD-10-CM | POA: Diagnosis not present

## 2020-08-27 DIAGNOSIS — E785 Hyperlipidemia, unspecified: Secondary | ICD-10-CM | POA: Diagnosis not present

## 2020-08-27 DIAGNOSIS — E291 Testicular hypofunction: Secondary | ICD-10-CM | POA: Diagnosis not present

## 2020-10-30 DIAGNOSIS — Z23 Encounter for immunization: Secondary | ICD-10-CM | POA: Diagnosis not present

## 2020-12-12 DIAGNOSIS — M545 Low back pain, unspecified: Secondary | ICD-10-CM | POA: Diagnosis not present

## 2021-01-04 DIAGNOSIS — F4323 Adjustment disorder with mixed anxiety and depressed mood: Secondary | ICD-10-CM | POA: Diagnosis not present

## 2021-01-16 DIAGNOSIS — F4323 Adjustment disorder with mixed anxiety and depressed mood: Secondary | ICD-10-CM | POA: Diagnosis not present

## 2021-01-31 DIAGNOSIS — F4323 Adjustment disorder with mixed anxiety and depressed mood: Secondary | ICD-10-CM | POA: Diagnosis not present

## 2021-02-07 DIAGNOSIS — F4323 Adjustment disorder with mixed anxiety and depressed mood: Secondary | ICD-10-CM | POA: Diagnosis not present

## 2021-02-14 DIAGNOSIS — F4323 Adjustment disorder with mixed anxiety and depressed mood: Secondary | ICD-10-CM | POA: Diagnosis not present

## 2021-02-20 DIAGNOSIS — F4323 Adjustment disorder with mixed anxiety and depressed mood: Secondary | ICD-10-CM | POA: Diagnosis not present

## 2021-02-20 DIAGNOSIS — R7309 Other abnormal glucose: Secondary | ICD-10-CM | POA: Diagnosis not present

## 2021-02-21 DIAGNOSIS — H6120 Impacted cerumen, unspecified ear: Secondary | ICD-10-CM | POA: Diagnosis not present

## 2021-02-28 DIAGNOSIS — F4323 Adjustment disorder with mixed anxiety and depressed mood: Secondary | ICD-10-CM | POA: Diagnosis not present

## 2021-03-06 DIAGNOSIS — R0981 Nasal congestion: Secondary | ICD-10-CM | POA: Diagnosis not present

## 2021-03-06 DIAGNOSIS — L089 Local infection of the skin and subcutaneous tissue, unspecified: Secondary | ICD-10-CM | POA: Diagnosis not present

## 2021-03-12 DIAGNOSIS — F4323 Adjustment disorder with mixed anxiety and depressed mood: Secondary | ICD-10-CM | POA: Diagnosis not present

## 2021-03-20 DIAGNOSIS — R7309 Other abnormal glucose: Secondary | ICD-10-CM | POA: Diagnosis not present

## 2021-03-21 DIAGNOSIS — F4323 Adjustment disorder with mixed anxiety and depressed mood: Secondary | ICD-10-CM | POA: Diagnosis not present

## 2021-04-10 DIAGNOSIS — F4323 Adjustment disorder with mixed anxiety and depressed mood: Secondary | ICD-10-CM | POA: Diagnosis not present

## 2021-04-20 DIAGNOSIS — R7309 Other abnormal glucose: Secondary | ICD-10-CM | POA: Diagnosis not present

## 2021-04-23 DIAGNOSIS — M545 Low back pain, unspecified: Secondary | ICD-10-CM | POA: Diagnosis not present

## 2021-04-23 DIAGNOSIS — Z1389 Encounter for screening for other disorder: Secondary | ICD-10-CM | POA: Diagnosis not present

## 2021-04-24 DIAGNOSIS — F4323 Adjustment disorder with mixed anxiety and depressed mood: Secondary | ICD-10-CM | POA: Diagnosis not present

## 2021-05-08 DIAGNOSIS — F4323 Adjustment disorder with mixed anxiety and depressed mood: Secondary | ICD-10-CM | POA: Diagnosis not present

## 2021-05-20 DIAGNOSIS — R7309 Other abnormal glucose: Secondary | ICD-10-CM | POA: Diagnosis not present

## 2021-06-05 DIAGNOSIS — F4323 Adjustment disorder with mixed anxiety and depressed mood: Secondary | ICD-10-CM | POA: Diagnosis not present

## 2021-06-12 DIAGNOSIS — F4323 Adjustment disorder with mixed anxiety and depressed mood: Secondary | ICD-10-CM | POA: Diagnosis not present

## 2021-06-20 DIAGNOSIS — I10 Essential (primary) hypertension: Secondary | ICD-10-CM | POA: Diagnosis not present

## 2021-06-20 DIAGNOSIS — F4323 Adjustment disorder with mixed anxiety and depressed mood: Secondary | ICD-10-CM | POA: Diagnosis not present

## 2021-06-20 DIAGNOSIS — E291 Testicular hypofunction: Secondary | ICD-10-CM | POA: Diagnosis not present

## 2021-06-20 DIAGNOSIS — R7301 Impaired fasting glucose: Secondary | ICD-10-CM | POA: Diagnosis not present

## 2021-06-20 DIAGNOSIS — Z125 Encounter for screening for malignant neoplasm of prostate: Secondary | ICD-10-CM | POA: Diagnosis not present

## 2021-06-20 DIAGNOSIS — R7309 Other abnormal glucose: Secondary | ICD-10-CM | POA: Diagnosis not present

## 2021-06-27 DIAGNOSIS — Z0000 Encounter for general adult medical examination without abnormal findings: Secondary | ICD-10-CM | POA: Diagnosis not present

## 2021-06-27 DIAGNOSIS — I10 Essential (primary) hypertension: Secondary | ICD-10-CM | POA: Diagnosis not present

## 2021-06-27 DIAGNOSIS — R82998 Other abnormal findings in urine: Secondary | ICD-10-CM | POA: Diagnosis not present

## 2021-06-27 DIAGNOSIS — Z23 Encounter for immunization: Secondary | ICD-10-CM | POA: Diagnosis not present

## 2021-06-27 DIAGNOSIS — Z1331 Encounter for screening for depression: Secondary | ICD-10-CM | POA: Diagnosis not present

## 2021-06-27 DIAGNOSIS — Z1339 Encounter for screening examination for other mental health and behavioral disorders: Secondary | ICD-10-CM | POA: Diagnosis not present

## 2021-07-20 DIAGNOSIS — R7309 Other abnormal glucose: Secondary | ICD-10-CM | POA: Diagnosis not present

## 2021-08-02 DIAGNOSIS — F4323 Adjustment disorder with mixed anxiety and depressed mood: Secondary | ICD-10-CM | POA: Diagnosis not present

## 2021-08-14 DIAGNOSIS — F4323 Adjustment disorder with mixed anxiety and depressed mood: Secondary | ICD-10-CM | POA: Diagnosis not present

## 2021-08-20 DIAGNOSIS — R7309 Other abnormal glucose: Secondary | ICD-10-CM | POA: Diagnosis not present

## 2021-08-29 DIAGNOSIS — I10 Essential (primary) hypertension: Secondary | ICD-10-CM | POA: Diagnosis not present

## 2021-08-29 DIAGNOSIS — K648 Other hemorrhoids: Secondary | ICD-10-CM | POA: Diagnosis not present

## 2021-09-02 DIAGNOSIS — M545 Low back pain, unspecified: Secondary | ICD-10-CM | POA: Diagnosis not present

## 2021-09-03 DIAGNOSIS — E8881 Metabolic syndrome: Secondary | ICD-10-CM | POA: Diagnosis not present

## 2021-09-05 DIAGNOSIS — F4323 Adjustment disorder with mixed anxiety and depressed mood: Secondary | ICD-10-CM | POA: Diagnosis not present

## 2021-09-09 DIAGNOSIS — M545 Low back pain, unspecified: Secondary | ICD-10-CM | POA: Diagnosis not present

## 2021-09-11 DIAGNOSIS — F4323 Adjustment disorder with mixed anxiety and depressed mood: Secondary | ICD-10-CM | POA: Diagnosis not present

## 2021-09-16 DIAGNOSIS — M545 Low back pain, unspecified: Secondary | ICD-10-CM | POA: Diagnosis not present

## 2021-09-18 DIAGNOSIS — F4323 Adjustment disorder with mixed anxiety and depressed mood: Secondary | ICD-10-CM | POA: Diagnosis not present

## 2021-09-20 DIAGNOSIS — R7309 Other abnormal glucose: Secondary | ICD-10-CM | POA: Diagnosis not present

## 2021-09-26 DIAGNOSIS — F4323 Adjustment disorder with mixed anxiety and depressed mood: Secondary | ICD-10-CM | POA: Diagnosis not present

## 2021-09-26 DIAGNOSIS — M545 Low back pain, unspecified: Secondary | ICD-10-CM | POA: Diagnosis not present

## 2021-10-03 DIAGNOSIS — F4323 Adjustment disorder with mixed anxiety and depressed mood: Secondary | ICD-10-CM | POA: Diagnosis not present

## 2021-10-07 DIAGNOSIS — M545 Low back pain, unspecified: Secondary | ICD-10-CM | POA: Diagnosis not present

## 2021-10-16 DIAGNOSIS — F4323 Adjustment disorder with mixed anxiety and depressed mood: Secondary | ICD-10-CM | POA: Diagnosis not present

## 2021-10-20 DIAGNOSIS — R7309 Other abnormal glucose: Secondary | ICD-10-CM | POA: Diagnosis not present

## 2021-10-24 DIAGNOSIS — M545 Low back pain, unspecified: Secondary | ICD-10-CM | POA: Diagnosis not present

## 2021-11-20 DIAGNOSIS — R7309 Other abnormal glucose: Secondary | ICD-10-CM | POA: Diagnosis not present

## 2021-12-20 DIAGNOSIS — R7309 Other abnormal glucose: Secondary | ICD-10-CM | POA: Diagnosis not present

## 2022-01-02 DIAGNOSIS — R7301 Impaired fasting glucose: Secondary | ICD-10-CM | POA: Diagnosis not present

## 2022-01-20 DIAGNOSIS — R7309 Other abnormal glucose: Secondary | ICD-10-CM | POA: Diagnosis not present

## 2022-02-20 DIAGNOSIS — R7309 Other abnormal glucose: Secondary | ICD-10-CM | POA: Diagnosis not present

## 2022-03-20 ENCOUNTER — Other Ambulatory Visit (HOSPITAL_COMMUNITY): Payer: Self-pay

## 2022-03-20 ENCOUNTER — Other Ambulatory Visit: Payer: Self-pay

## 2022-03-20 MED ORDER — SAXENDA 18 MG/3ML ~~LOC~~ SOPN
PEN_INJECTOR | SUBCUTANEOUS | 0 refills | Status: AC
  Filled 2022-03-20: qty 15, 30d supply, fill #0
  Filled 2022-03-21 – 2022-03-24 (×4): qty 15, 35d supply, fill #0
  Filled 2022-03-24 – 2022-03-25 (×3): qty 15, 30d supply, fill #0

## 2022-03-21 ENCOUNTER — Other Ambulatory Visit (HOSPITAL_COMMUNITY): Payer: Self-pay

## 2022-03-21 DIAGNOSIS — R7309 Other abnormal glucose: Secondary | ICD-10-CM | POA: Diagnosis not present

## 2022-03-24 ENCOUNTER — Other Ambulatory Visit (HOSPITAL_COMMUNITY): Payer: Self-pay

## 2022-03-25 ENCOUNTER — Other Ambulatory Visit (HOSPITAL_COMMUNITY): Payer: Self-pay

## 2022-04-12 DIAGNOSIS — M79645 Pain in left finger(s): Secondary | ICD-10-CM | POA: Diagnosis not present

## 2022-04-12 DIAGNOSIS — S60132A Contusion of left middle finger with damage to nail, initial encounter: Secondary | ICD-10-CM | POA: Diagnosis not present

## 2022-04-21 DIAGNOSIS — R7309 Other abnormal glucose: Secondary | ICD-10-CM | POA: Diagnosis not present

## 2022-04-30 ENCOUNTER — Encounter: Payer: Self-pay | Admitting: Gastroenterology

## 2022-05-21 DIAGNOSIS — R7309 Other abnormal glucose: Secondary | ICD-10-CM | POA: Diagnosis not present

## 2022-06-10 DIAGNOSIS — H0288A Meibomian gland dysfunction right eye, upper and lower eyelids: Secondary | ICD-10-CM | POA: Diagnosis not present

## 2022-06-10 DIAGNOSIS — H16223 Keratoconjunctivitis sicca, not specified as Sjogren's, bilateral: Secondary | ICD-10-CM | POA: Diagnosis not present

## 2022-06-10 DIAGNOSIS — H0288B Meibomian gland dysfunction left eye, upper and lower eyelids: Secondary | ICD-10-CM | POA: Diagnosis not present

## 2022-06-21 DIAGNOSIS — R7309 Other abnormal glucose: Secondary | ICD-10-CM | POA: Diagnosis not present

## 2022-06-25 DIAGNOSIS — E8881 Metabolic syndrome: Secondary | ICD-10-CM | POA: Diagnosis not present

## 2022-06-25 DIAGNOSIS — I10 Essential (primary) hypertension: Secondary | ICD-10-CM | POA: Diagnosis not present

## 2022-06-25 DIAGNOSIS — R7301 Impaired fasting glucose: Secondary | ICD-10-CM | POA: Diagnosis not present

## 2022-06-27 DIAGNOSIS — H6122 Impacted cerumen, left ear: Secondary | ICD-10-CM | POA: Diagnosis not present

## 2022-07-18 ENCOUNTER — Encounter: Payer: Self-pay | Admitting: Gastroenterology

## 2022-07-21 DIAGNOSIS — R7309 Other abnormal glucose: Secondary | ICD-10-CM | POA: Diagnosis not present

## 2022-08-08 ENCOUNTER — Ambulatory Visit (AMBULATORY_SURGERY_CENTER): Payer: Self-pay

## 2022-08-08 VITALS — Ht 67.0 in | Wt 236.0 lb

## 2022-08-08 DIAGNOSIS — E8881 Metabolic syndrome: Secondary | ICD-10-CM | POA: Insufficient documentation

## 2022-08-08 DIAGNOSIS — Z1211 Encounter for screening for malignant neoplasm of colon: Secondary | ICD-10-CM

## 2022-08-08 MED ORDER — NA SULFATE-K SULFATE-MG SULF 17.5-3.13-1.6 GM/177ML PO SOLN
1.0000 | Freq: Once | ORAL | 0 refills | Status: AC
Start: 2022-08-08 — End: 2022-08-08

## 2022-08-08 NOTE — Progress Notes (Signed)
No egg or soy allergy known to patient  No issues known to pt with past sedation with any surgeries or procedures Patient denies ever being told they had issues or difficulty with intubation  No FH of Malignant Hyperthermia Pt is not on diet pills Pt is not on  home 02  Pt is not on blood thinners  Pt reports occ constipation  No A fib or A flutter Have any cardiac testing pending--no  LOA: independent  Prep: suprep   Patient's chart reviewed by Cathlyn Parsons CNRA prior to previsit and patient appropriate for the [facility].  Previsit completed and red dot placed by patient's name on their procedure day (on provider's schedule).     PV competed with patient. Prep instructions sent via mychart and home address. Goodrx coupon for [employer] provided to use for price reduction if needed.

## 2022-08-21 ENCOUNTER — Encounter: Payer: Self-pay | Admitting: Gastroenterology

## 2022-08-21 DIAGNOSIS — R7309 Other abnormal glucose: Secondary | ICD-10-CM | POA: Diagnosis not present

## 2022-09-03 ENCOUNTER — Encounter: Payer: Self-pay | Admitting: Gastroenterology

## 2022-09-03 ENCOUNTER — Ambulatory Visit: Payer: BC Managed Care – PPO | Admitting: Gastroenterology

## 2022-09-03 VITALS — BP 110/68 | HR 59 | Temp 98.6°F | Resp 14 | Ht 67.0 in | Wt 230.0 lb

## 2022-09-03 DIAGNOSIS — K219 Gastro-esophageal reflux disease without esophagitis: Secondary | ICD-10-CM

## 2022-09-03 DIAGNOSIS — I10 Essential (primary) hypertension: Secondary | ICD-10-CM | POA: Diagnosis not present

## 2022-09-03 DIAGNOSIS — Z125 Encounter for screening for malignant neoplasm of prostate: Secondary | ICD-10-CM | POA: Diagnosis not present

## 2022-09-03 DIAGNOSIS — Z1211 Encounter for screening for malignant neoplasm of colon: Secondary | ICD-10-CM | POA: Diagnosis not present

## 2022-09-03 DIAGNOSIS — E291 Testicular hypofunction: Secondary | ICD-10-CM | POA: Diagnosis not present

## 2022-09-03 DIAGNOSIS — R7301 Impaired fasting glucose: Secondary | ICD-10-CM | POA: Diagnosis not present

## 2022-09-03 MED ORDER — SODIUM CHLORIDE 0.9 % IV SOLN: 500.0000 mL | Freq: Once | INTRAVENOUS | Status: DC

## 2022-09-03 MED ORDER — RABEPRAZOLE SODIUM 20 MG PO TBEC: 20.0000 mg | DELAYED_RELEASE_TABLET | Freq: Two times a day (BID) | ORAL | 3 refills | Status: DC

## 2022-09-03 NOTE — Progress Notes (Unsigned)
GASTROENTEROLOGY PROCEDURE H&P NOTE   Primary Care Physician: Cleatis Polka., MD  HPI: Blake Ibarra is a 52 y.o. male who presents for Colonoscopy for screening.  Past Medical History:  Diagnosis Date   GERD (gastroesophageal reflux disease)    Hypertension    Sleep apnea    mild not using cpap   Past Surgical History:  Procedure Laterality Date   COLONOSCOPY  2014   no prior surgery     Current Outpatient Medications  Medication Sig Dispense Refill   amLODipine (NORVASC) 5 MG tablet Take 5 mg by mouth daily.     Brompheniramine-Pseudoeph (BROMALINE PO) Take 500 mg by mouth daily.     calcium carbonate (OS-CAL) 1250 (500 Ca) MG chewable tablet Chew 1 tablet by mouth daily.     cetirizine (ZYRTEC) 10 MG tablet Take 10 mg by mouth daily.     Cholecalciferol (VITAMIN D-3) 5000 UNITS TABS Take 4,000 Units by mouth daily.     co-enzyme Q-10 30 MG capsule Take by mouth.     escitalopram (LEXAPRO) 10 MG tablet Take 10 mg by mouth daily.     lisinopril (PRINIVIL,ZESTRIL) 40 MG tablet Take 40 mg by mouth daily.     Magnesium Gluconate 250 MG TABS 1 tablet Orally Once a day     Multiple Vitamin (ONE DAILY MULTIVITAMIN ADULT) TABS TAKE ONE EVERY DAY Oral     Omega-3 Fatty Acids (FISH OIL) 1000 MG CAPS 4 capsules daily Oral     RABEprazole (ACIPHEX) 20 MG tablet Take 20 mg by mouth daily.     Testosterone 30 MG/ACT SOLN SMARTSIG:4 Pump Topical Daily     XIIDRA 5 % SOLN 1 drop into affected eye Ophthalmic Twice a day     Misc Natural Products (BEET ROOT) 500 MG CAPS as directed Orally     saccharomyces boulardii (FLORASTOR) 250 MG capsule Take 250 mg by mouth.     sildenafil (VIAGRA) 100 MG tablet take 1/2 to one by mouth 30 mins prior to intercourse . Oral once a day as needed for 30 days     ZEPBOUND 7.5 MG/0.5ML Pen Inject 7.5 mg into the skin once a week.     Current Facility-Administered Medications  Medication Dose Route Frequency Provider Last Rate Last Admin   0.9 %   sodium chloride infusion  500 mL Intravenous Once Mansouraty, Netty Starring., MD        Current Outpatient Medications:    amLODipine (NORVASC) 5 MG tablet, Take 5 mg by mouth daily., Disp: , Rfl:    Brompheniramine-Pseudoeph (BROMALINE PO), Take 500 mg by mouth daily., Disp: , Rfl:    calcium carbonate (OS-CAL) 1250 (500 Ca) MG chewable tablet, Chew 1 tablet by mouth daily., Disp: , Rfl:    cetirizine (ZYRTEC) 10 MG tablet, Take 10 mg by mouth daily., Disp: , Rfl:    Cholecalciferol (VITAMIN D-3) 5000 UNITS TABS, Take 4,000 Units by mouth daily., Disp: , Rfl:    co-enzyme Q-10 30 MG capsule, Take by mouth., Disp: , Rfl:    escitalopram (LEXAPRO) 10 MG tablet, Take 10 mg by mouth daily., Disp: , Rfl:    lisinopril (PRINIVIL,ZESTRIL) 40 MG tablet, Take 40 mg by mouth daily., Disp: , Rfl:    Magnesium Gluconate 250 MG TABS, 1 tablet Orally Once a day, Disp: , Rfl:    Multiple Vitamin (ONE DAILY MULTIVITAMIN ADULT) TABS, TAKE ONE EVERY DAY Oral, Disp: , Rfl:    Omega-3 Fatty Acids (FISH OIL)  1000 MG CAPS, 4 capsules daily Oral, Disp: , Rfl:    RABEprazole (ACIPHEX) 20 MG tablet, Take 20 mg by mouth daily., Disp: , Rfl:    Testosterone 30 MG/ACT SOLN, SMARTSIG:4 Pump Topical Daily, Disp: , Rfl:    XIIDRA 5 % SOLN, 1 drop into affected eye Ophthalmic Twice a day, Disp: , Rfl:    Misc Natural Products (BEET ROOT) 500 MG CAPS, as directed Orally, Disp: , Rfl:    saccharomyces boulardii (FLORASTOR) 250 MG capsule, Take 250 mg by mouth., Disp: , Rfl:    sildenafil (VIAGRA) 100 MG tablet, take 1/2 to one by mouth 30 mins prior to intercourse . Oral once a day as needed for 30 days, Disp: , Rfl:    ZEPBOUND 7.5 MG/0.5ML Pen, Inject 7.5 mg into the skin once a week., Disp: , Rfl:   Current Facility-Administered Medications:    0.9 %  sodium chloride infusion, 500 mL, Intravenous, Once, Mansouraty, Netty Starring., MD Allergies  Allergen Reactions   Liraglutide -Weight Management     Other Reaction(s): did  not work for him   Family History  Problem Relation Age of Onset   Hypothyroidism Mother    Stroke Maternal Grandmother    Glaucoma Maternal Grandmother    Macular degeneration Maternal Grandmother    Cancer Maternal Grandfather        stomach   Colon cancer Neg Hx    Esophageal cancer Neg Hx    Rectal cancer Neg Hx    Colon polyps Neg Hx    Stomach cancer Neg Hx    Social History   Socioeconomic History   Marital status: Married    Spouse name: Not on file   Number of children: Not on file   Years of education: Not on file   Highest education level: Not on file  Occupational History   Not on file  Tobacco Use   Smoking status: Never   Smokeless tobacco: Never  Vaping Use   Vaping status: Never Used  Substance and Sexual Activity   Alcohol use: Yes    Comment: occasional   Drug use: No   Sexual activity: Not on file  Other Topics Concern   Not on file  Social History Narrative   Not on file   Social Determinants of Health   Financial Resource Strain: Not on file  Food Insecurity: Not on file  Transportation Needs: Not on file  Physical Activity: Not on file  Stress: Not on file  Social Connections: Not on file  Intimate Partner Violence: Unknown (05/08/2021)   Received from [facility]   SLHS Safety Screener    SDOH Safety Safe where you live Question: Not on file    SDOH Safety Physically hurt Question: Not on file    SDOH Safety Emotionally abused Question: Not on file    Physical Exam: Today's Vitals   09/03/22 1535 09/03/22 1540  BP: 118/69   Pulse: 68   Temp: 98.6 F (37 C) 98.6 F (37 C)  TempSrc: Temporal   SpO2: 94%   Weight: 230 lb (104.3 kg)   Height: 5\' 7"  (1.702 m)    Body mass index is 36.02 kg/m. GEN: NAD EYE: Sclerae anicteric ENT: MMM CV: Non-tachycardic GI: Soft, NT/ND NEURO:  Alert & Oriented x 3  Lab Results: No results for input(s): "WBC", "HGB", "HCT", "PLT" in the last 72 hours. BMET No results for  input(s): "NA", "K", "CL", "CO2", "GLUCOSE", "BUN", "CREATININE", "CALCIUM" in the last  72 hours. LFT No results for input(s): "PROT", "ALBUMIN", "AST", "ALT", "ALKPHOS", "BILITOT", "BILIDIR", "IBILI" in the last 72 hours. PT/INR No results for input(s): "LABPROT", "INR" in the last 72 hours.   Impression / Plan: This is a 52 y.o.male who presents for Colonoscopy for screening.  The risks and benefits of endoscopic evaluation/treatment were discussed with the patient and/or family; these include but are not limited to the risk of perforation, infection, bleeding, missed lesions, lack of diagnosis, severe illness requiring hospitalization, as well as anesthesia and sedation related illnesses.  The patient's history has been reviewed, patient examined, no change in status, and deemed stable for procedure.  The patient and/or family is agreeable to proceed.    Corliss Parish, MD [facility] Advanced Endoscopy Office # [phone]

## 2022-09-03 NOTE — Progress Notes (Unsigned)
Sedate, gd SR, tolerated procedure well, VSS, report to RN 

## 2022-09-03 NOTE — Progress Notes (Signed)
Pt's states no medical or surgical changes since previsit or office visit. 

## 2022-09-03 NOTE — Op Note (Signed)
[facility] Patient Name: Blake Ibarra Procedure Date: 09/03/2022 4:18 PM MRN: [id number] Endoscopist: Corliss Parish , MD, [id number] Age: 52 Referring MD:  Date of Birth: October 09, 1970 Gender: Male Account #: [id number] Procedure:                Colonoscopy Indications:              Screening for colorectal malignant neoplasm Medicines:                Monitored Anesthesia Care Procedure:                Pre-Anesthesia Assessment:                           - Prior to the procedure, a History and Physical                            was performed, and patient medications and                            allergies were reviewed. The patient's tolerance of                            previous anesthesia was also reviewed. The risks                            and benefits of the procedure and the sedation                            options and risks were discussed with the patient.                            All questions were answered, and informed consent                            was obtained. Prior Anticoagulants: The patient has                            taken no anticoagulant or antiplatelet agents. ASA                            Grade Assessment: II - A patient with mild systemic                            disease. After reviewing the risks and benefits,                            the patient was deemed in satisfactory condition to                            undergo the procedure.                           After obtaining informed consent, the colonoscope  was passed under direct vision. Throughout the                            procedure, the patient's blood pressure, pulse, and                            oxygen saturations were monitored continuously. The                            Olympus Scope [id number] was introduced through the                            anus and advanced to the 3 cm into the ileum. The                             colonoscopy was performed without difficulty. The                            patient tolerated the procedure. The quality of the                            bowel preparation was good. The terminal ileum,                            ileocecal valve, appendiceal orifice, and rectum                            were photographed. Scope In: 4:37:43 PM Scope Out: 4:47:53 PM Scope Withdrawal Time: 0 hours 7 minutes 44 seconds  Total Procedure Duration: 0 hours 10 minutes 10 seconds  Findings:                 The digital rectal exam findings include                            hemorrhoids. Pertinent negatives include no                            palpable rectal lesions.                           The terminal ileum and ileocecal valve appeared                            normal.                           Normal mucosa was found in the entire colon.                           Non-bleeding non-thrombosed external and internal                            hemorrhoids were found during retroflexion, during  perianal exam and during digital exam. The                            hemorrhoids were Grade II (internal hemorrhoids                            that prolapse but reduce spontaneously). Complications:            No immediate complications. Estimated Blood Loss:     Estimated blood loss was minimal. Impression:               - Hemorrhoids found on digital rectal exam.                           - The examined portion of the ileum was normal.                           - Normal mucosa in the entire examined colon.                           - Non-bleeding non-thrombosed external and internal                            hemorrhoids. Recommendation:           - The patient will be observed post-procedure,                            until all discharge criteria are met.                           - Discharge patient to home.                           - Patient has a contact number  available for                            emergencies. The signs and symptoms of potential                            delayed complications were discussed with the                            patient. Return to normal activities tomorrow.                            Written discharge instructions were provided to the                            patient.                           - High fiber diet.                           - Use FiberCon 1-2 tablets PO daily.                           -  Continue present medications.                           - Repeat colonoscopy in 10 years for screening                            purposes.                           - The findings and recommendations were discussed                            with the patient.                           - The findings and recommendations were discussed                            with the patient's family. Corliss Parish, MD 09/03/2022 4:56:15 PM

## 2022-09-03 NOTE — Patient Instructions (Addendum)
High fiber diet.  See handout Use FiberCon 1-2 tablets PO daily. Continue present medications. Repeat colonoscopy in 10 years for screening purposes.                        YOU HAD AN ENDOSCOPIC PROCEDURE TODAY AT THE [facility]:   Refer to the procedure report that was given to you for any specific questions about what was found during the examination.  If the procedure report does not answer your questions, please call your gastroenterologist to clarify.  If you requested that your care partner not be given the details of your procedure findings, then the procedure report has been included in a sealed envelope for you to review at your convenience later.  YOU SHOULD EXPECT: Some feelings of bloating in the abdomen. Passage of more gas than usual.  Walking can help get rid of the air that was put into your GI tract during the procedure and reduce the bloating. If you had a lower endoscopy (such as a colonoscopy or flexible sigmoidoscopy) you may notice spotting of blood in your stool or on the toilet paper. If you underwent a bowel prep for your procedure, you may not have a normal bowel movement for a few days.  Please Note:  You might notice some irritation and congestion in your nose or some drainage.  This is from the oxygen used during your procedure.  There is no need for concern and it should clear up in a day or so.  SYMPTOMS TO REPORT IMMEDIATELY:  Following lower endoscopy (colonoscopy or flexible sigmoidoscopy):  Excessive amounts of blood in the stool  Significant tenderness or worsening of abdominal pains  Swelling of the abdomen that is new, acute  Fever of 100F or higher  For urgent or emergent issues, a gastroenterologist can be reached at any hour by calling [phone]. Do not use MyChart messaging for urgent concerns.    DIET:  We do recommend a small meal at first, but then you may proceed to your regular diet.  Drink plenty of fluids but you should  avoid alcoholic beverages for 24 hours.  ACTIVITY:  You should plan to take it easy for the rest of today and you should NOT DRIVE or use heavy machinery until tomorrow (because of the sedation medicines used during the test).    FOLLOW UP: Our staff will call the number listed on your records the next business day following your procedure.  We will call around 7:15- 8:00 am to check on you and address any questions or concerns that you may have regarding the information given to you following your procedure. If we do not reach you, we will leave a message.     If any biopsies were taken you will be contacted by phone or by letter within the next 1-3 weeks.  Please call us at [phone] if you have not heard about the biopsies in 3 weeks.    SIGNATURES/CONFIDENTIALITY: You and/or your care partner have signed paperwork which will be entered into your electronic medical record.  These signatures attest to the fact that that the information above on your After Visit Summary has been reviewed and is understood.  Full responsibility of the confidentiality of this discharge information lies with you and/or your care-partner.

## 2022-09-03 NOTE — Progress Notes (Unsigned)
Schedule EGD for GERD  Aciphex 20 mg BID, #60, 3 refills per Dr. Meridee Score

## 2022-09-04 ENCOUNTER — Encounter: Payer: Self-pay | Admitting: Gastroenterology

## 2022-09-04 ENCOUNTER — Telehealth: Payer: Self-pay | Admitting: *Deleted

## 2022-09-04 NOTE — Telephone Encounter (Signed)
Unable to reach patient for follow up call. Invalid number given.

## 2022-09-10 DIAGNOSIS — Z1331 Encounter for screening for depression: Secondary | ICD-10-CM | POA: Diagnosis not present

## 2022-09-10 DIAGNOSIS — E291 Testicular hypofunction: Secondary | ICD-10-CM | POA: Diagnosis not present

## 2022-09-10 DIAGNOSIS — I10 Essential (primary) hypertension: Secondary | ICD-10-CM | POA: Diagnosis not present

## 2022-09-10 DIAGNOSIS — Z0000 Encounter for general adult medical examination without abnormal findings: Secondary | ICD-10-CM | POA: Diagnosis not present

## 2022-09-10 DIAGNOSIS — R82998 Other abnormal findings in urine: Secondary | ICD-10-CM | POA: Diagnosis not present

## 2022-09-10 DIAGNOSIS — Z1339 Encounter for screening examination for other mental health and behavioral disorders: Secondary | ICD-10-CM | POA: Diagnosis not present

## 2022-09-15 ENCOUNTER — Telehealth: Payer: Self-pay | Admitting: Gastroenterology

## 2022-09-15 NOTE — Telephone Encounter (Signed)
Inbound call from patient requesting new prep instructions be sent through mychart for 9/3 endoscopy. Please advise, thank you.

## 2022-09-15 NOTE — Telephone Encounter (Signed)
Updated instructions have been sent via mychart. Attempted to call patient to make him aware and to confirm he has held his zepbound injection. Unable to reach patient phone on file is saying disconnected.

## 2022-09-16 ENCOUNTER — Encounter: Payer: BC Managed Care – PPO | Admitting: Gastroenterology

## 2022-09-18 DIAGNOSIS — H0288A Meibomian gland dysfunction right eye, upper and lower eyelids: Secondary | ICD-10-CM | POA: Diagnosis not present

## 2022-09-18 DIAGNOSIS — H0288B Meibomian gland dysfunction left eye, upper and lower eyelids: Secondary | ICD-10-CM | POA: Diagnosis not present

## 2022-09-18 DIAGNOSIS — H16223 Keratoconjunctivitis sicca, not specified as Sjogren's, bilateral: Secondary | ICD-10-CM | POA: Diagnosis not present

## 2022-09-18 DIAGNOSIS — H10823 Rosacea conjunctivitis, bilateral: Secondary | ICD-10-CM | POA: Diagnosis not present

## 2022-09-18 DIAGNOSIS — H11423 Conjunctival edema, bilateral: Secondary | ICD-10-CM | POA: Diagnosis not present

## 2022-09-21 DIAGNOSIS — R7309 Other abnormal glucose: Secondary | ICD-10-CM | POA: Diagnosis not present

## 2022-09-23 ENCOUNTER — Ambulatory Visit (AMBULATORY_SURGERY_CENTER): Payer: BC Managed Care – PPO | Admitting: Gastroenterology

## 2022-09-23 ENCOUNTER — Encounter: Payer: Self-pay | Admitting: Gastroenterology

## 2022-09-23 VITALS — BP 128/78 | HR 59 | Temp 97.5°F | Resp 13

## 2022-09-23 DIAGNOSIS — K219 Gastro-esophageal reflux disease without esophagitis: Secondary | ICD-10-CM | POA: Diagnosis not present

## 2022-09-23 DIAGNOSIS — K229 Disease of esophagus, unspecified: Secondary | ICD-10-CM

## 2022-09-23 DIAGNOSIS — K317 Polyp of stomach and duodenum: Secondary | ICD-10-CM

## 2022-09-23 DIAGNOSIS — K319 Disease of stomach and duodenum, unspecified: Secondary | ICD-10-CM | POA: Diagnosis not present

## 2022-09-23 MED ORDER — RABEPRAZOLE SODIUM 20 MG PO TBEC: 20.0000 mg | DELAYED_RELEASE_TABLET | Freq: Two times a day (BID) | ORAL | 6 refills | Status: AC

## 2022-09-23 MED ORDER — SODIUM CHLORIDE 0.9 % IV SOLN: 500.0000 mL | Freq: Once | INTRAVENOUS | Status: AC

## 2022-09-23 NOTE — Progress Notes (Signed)
Called to room to assist during endoscopic procedure.  Patient ID and intended procedure confirmed with present staff. Received instructions for my participation in the procedure from the performing physician.  

## 2022-09-23 NOTE — Progress Notes (Signed)
Uneventful anesthetic. Report to pacu rn. Vss. Care resumed by rn. 

## 2022-09-23 NOTE — Patient Instructions (Signed)

## 2022-09-23 NOTE — Progress Notes (Signed)
GASTROENTEROLOGY PROCEDURE H&P NOTE   Primary Care Physician: Cleatis Polka., MD  HPI: Blake Ibarra is a 52 y.o. male who presents for EGD for evaluation of progressive GERD symptoms requiring medication titration.  Past Medical History:  Diagnosis Date   GERD (gastroesophageal reflux disease)    Hypertension    Sleep apnea    mild not using cpap   Past Surgical History:  Procedure Laterality Date   COLONOSCOPY  2014   no prior surgery     Current Outpatient Medications  Medication Sig Dispense Refill   amLODipine (NORVASC) 5 MG tablet Take 5 mg by mouth daily.     Brompheniramine-Pseudoeph (BROMALINE PO) Take 500 mg by mouth daily.     calcium carbonate (OS-CAL) 1250 (500 Ca) MG chewable tablet Chew 1 tablet by mouth daily.     cetirizine (ZYRTEC) 10 MG tablet Take 10 mg by mouth daily.     Cholecalciferol (VITAMIN D-3) 5000 UNITS TABS Take 4,000 Units by mouth daily.     co-enzyme Q-10 30 MG capsule Take by mouth.     escitalopram (LEXAPRO) 10 MG tablet Take 10 mg by mouth daily.     lisinopril (PRINIVIL,ZESTRIL) 40 MG tablet Take 40 mg by mouth daily.     Magnesium Gluconate 250 MG TABS 1 tablet Orally Once a day     Misc Natural Products (BEET ROOT) 500 MG CAPS as directed Orally     Multiple Vitamin (ONE DAILY MULTIVITAMIN ADULT) TABS TAKE ONE EVERY DAY Oral     Omega-3 Fatty Acids (FISH OIL) 1000 MG CAPS 4 capsules daily Oral     RABEprazole (ACIPHEX) 20 MG tablet Take 1 tablet (20 mg total) by mouth in the morning and at bedtime. 60 tablet 3   saccharomyces boulardii (FLORASTOR) 250 MG capsule Take 250 mg by mouth.     sildenafil (VIAGRA) 100 MG tablet take 1/2 to one by mouth 30 mins prior to intercourse . Oral once a day as needed for 30 days     Testosterone 30 MG/ACT SOLN SMARTSIG:4 Pump Topical Daily     XIIDRA 5 % SOLN 1 drop into affected eye Ophthalmic Twice a day     ZEPBOUND 7.5 MG/0.5ML Pen Inject 7.5 mg into the skin once a week.     No current  facility-administered medications for this visit.    Current Outpatient Medications:    amLODipine (NORVASC) 5 MG tablet, Take 5 mg by mouth daily., Disp: , Rfl:    Brompheniramine-Pseudoeph (BROMALINE PO), Take 500 mg by mouth daily., Disp: , Rfl:    calcium carbonate (OS-CAL) 1250 (500 Ca) MG chewable tablet, Chew 1 tablet by mouth daily., Disp: , Rfl:    cetirizine (ZYRTEC) 10 MG tablet, Take 10 mg by mouth daily., Disp: , Rfl:    Cholecalciferol (VITAMIN D-3) 5000 UNITS TABS, Take 4,000 Units by mouth daily., Disp: , Rfl:    co-enzyme Q-10 30 MG capsule, Take by mouth., Disp: , Rfl:    escitalopram (LEXAPRO) 10 MG tablet, Take 10 mg by mouth daily., Disp: , Rfl:    lisinopril (PRINIVIL,ZESTRIL) 40 MG tablet, Take 40 mg by mouth daily., Disp: , Rfl:    Magnesium Gluconate 250 MG TABS, 1 tablet Orally Once a day, Disp: , Rfl:    Misc Natural Products (BEET ROOT) 500 MG CAPS, as directed Orally, Disp: , Rfl:    Multiple Vitamin (ONE DAILY MULTIVITAMIN ADULT) TABS, TAKE ONE EVERY DAY Oral, Disp: , Rfl:  Omega-3 Fatty Acids (FISH OIL) 1000 MG CAPS, 4 capsules daily Oral, Disp: , Rfl:    RABEprazole (ACIPHEX) 20 MG tablet, Take 1 tablet (20 mg total) by mouth in the morning and at bedtime., Disp: 60 tablet, Rfl: 3   saccharomyces boulardii (FLORASTOR) 250 MG capsule, Take 250 mg by mouth., Disp: , Rfl:    sildenafil (VIAGRA) 100 MG tablet, take 1/2 to one by mouth 30 mins prior to intercourse . Oral once a day as needed for 30 days, Disp: , Rfl:    Testosterone 30 MG/ACT SOLN, SMARTSIG:4 Pump Topical Daily, Disp: , Rfl:    XIIDRA 5 % SOLN, 1 drop into affected eye Ophthalmic Twice a day, Disp: , Rfl:    ZEPBOUND 7.5 MG/0.5ML Pen, Inject 7.5 mg into the skin once a week., Disp: , Rfl:  Allergies  Allergen Reactions   Liraglutide -Weight Management     Other Reaction(s): did not work for him   Family History  Problem Relation Age of Onset   Hypothyroidism Mother    Stroke Maternal  Grandmother    Glaucoma Maternal Grandmother    Macular degeneration Maternal Grandmother    Cancer Maternal Grandfather        stomach   Colon cancer Neg Hx    Esophageal cancer Neg Hx    Rectal cancer Neg Hx    Colon polyps Neg Hx    Stomach cancer Neg Hx    Social History   Socioeconomic History   Marital status: Married    Spouse name: Not on file   Number of children: Not on file   Years of education: Not on file   Highest education level: Not on file  Occupational History   Not on file  Tobacco Use   Smoking status: Never   Smokeless tobacco: Never  Vaping Use   Vaping status: Never Used  Substance and Sexual Activity   Alcohol use: Yes    Comment: occasional   Drug use: No   Sexual activity: Not on file  Other Topics Concern   Not on file  Social History Narrative   Not on file   Social Determinants of Health   Financial Resource Strain: Not on file  Food Insecurity: Not on file  Transportation Needs: Not on file  Physical Activity: Not on file  Stress: Not on file  Social Connections: Not on file  Intimate Partner Violence: Unknown (05/08/2021)   Received from [facility]   [employer]HS Safety Screener    SDOH Safety Safe where you live Question: Not on file    SDOH Safety Physically hurt Question: Not on file    SDOH Safety Emotionally abused Question: Not on file    Physical Exam: There were no vitals filed for this visit. There is no height or weight on file to calculate BMI. GEN: NAD EYE: Sclerae anicteric ENT: MMM CV: Non-tachycardic GI: Soft, NT/ND NEURO:  Alert & Oriented x 3  Lab Results: No results for input(s): "WBC", "HGB", "HCT", "PLT" in the last 72 hours. BMET No results for input(s): "NA", "K", "CL", "CO2", "GLUCOSE", "BUN", "CREATININE", "CALCIUM" in the last 72 hours. LFT No results for input(s): "PROT", "ALBUMIN", "AST", "ALT", "ALKPHOS", "BILITOT", "BILIDIR", "IBILI" in the last 72 hours. PT/INR No results for  input(s): "LABPROT", "INR" in the last 72 hours.   Impression / Plan: This is a 52 y.o.male who presents for EGD for evaluation of progressive GERD symptoms requiring medication titration.  The risks and benefits of  endoscopic evaluation/treatment were discussed with the patient and/or family; these include but are not limited to the risk of perforation, infection, bleeding, missed lesions, lack of diagnosis, severe illness requiring hospitalization, as well as anesthesia and sedation related illnesses.  The patient's history has been reviewed, patient examined, no change in status, and deemed stable for procedure.  The patient and/or family is agreeable to proceed.    Corliss Parish, MD [facility] Office # [phone]

## 2022-09-23 NOTE — Op Note (Signed)
[facility] Patient Name: Blake Ibarra Procedure Date: 09/23/2022 10:50 AM MRN: [id number] Endoscopist: Corliss Parish , MD, [id number] Age: 52 Referring MD:  Date of Birth: 01/11/1971 Gender: Male Account #: [id number] Procedure:                Upper GI endoscopy Indications:              Esophageal reflux symptoms that recur despite                            appropriate therapy Medicines:                Monitored Anesthesia Care Procedure:                Pre-Anesthesia Assessment:                           - Prior to the procedure, a History and Physical                            was performed, and patient medications and                            allergies were reviewed. The patient's tolerance of                            previous anesthesia was also reviewed. The risks                            and benefits of the procedure and the sedation                            options and risks were discussed with the patient.                            All questions were answered, and informed consent                            was obtained. Prior Anticoagulants: The patient has                            taken no anticoagulant or antiplatelet agents. ASA                            Grade Assessment: II - A patient with mild systemic                            disease. After reviewing the risks and benefits,                            the patient was deemed in satisfactory condition to                            undergo the procedure.  After obtaining informed consent, the endoscope was                            passed under direct vision. Throughout the                            procedure, the patient's blood pressure, pulse, and                            oxygen saturations were monitored continuously. The                            Olympus Scope [id number] was introduced through the                            mouth, and advanced to the second  part of duodenum.                            The upper GI endoscopy was accomplished without                            difficulty. The patient tolerated the procedure. Scope In: Scope Out: Findings:                 No gross lesions were noted in the entire esophagus.                           The Z-line was irregular and was found 39 cm from                            the incisors.                           Multiple 5 to 10 mm semi-sessile polyps with no                            bleeding and no stigmata of recent bleeding were                            found in the gastric fundus and in the gastric                            body. These are most consistent with fundic gland                            polyps. Biopsies were taken with a cold forceps for                            histology.                           A single 8 mm sessile polyp was found in the  gastric antrum. The polyp was removed with a cold                            snare. Resection and retrieval were complete.                           Striped mildly erythematous mucosa without bleeding                            was found in the gastric antrum.                           No other gross lesions were noted in the entire                            examined stomach. Biopsies were taken with a cold                            forceps for histology and Helicobacter pylori                            testing.                           No gross lesions were noted in the duodenal bulb,                            in the first portion of the duodenum and in the                            second portion of the duodenum. Complications:            No immediate complications. Estimated Blood Loss:     Estimated blood loss was minimal. Impression:               - No gross lesions in the entire esophagus. Z-line                            irregular, 39 cm from the incisors.                           -  Multiple gastric polyps in the proximal stomach                            most consistent with fundic gland polyps. Biopsied.                           - A single sessile gastric polyp in the antrum.                            Resected and retrieved.                           - Erythematous mucosa in the antrum. No other gross  lesions in the entire stomach. Biopsied.                           - No gross lesions in the duodenal bulb, in the                            first portion of the duodenum and in the second                            portion of the duodenum. Recommendation:           - The patient will be observed post-procedure,                            until all discharge criteria are met.                           - Discharge patient to home.                           - Patient has a contact number available for                            emergencies. The signs and symptoms of potential                            delayed complications were discussed with the                            patient. Return to normal activities tomorrow.                            Written discharge instructions were provided to the                            patient.                           - Resume previous diet.                           - Continue present medications (we will send in an                            updated prescription for Aciphex twice daily) due                            to his increased symptoms of GERD improved with                            being on twice daily therapy.                           - Await pathology results.                           -  Repeat upper endoscopy for surveillance will be                            recommended if adenomatous tissue is found.                           - The findings and recommendations were discussed                            with the patient. Corliss Parish, MD 09/23/2022 11:11:23 AM

## 2022-09-23 NOTE — Progress Notes (Signed)
Pt's states no medical or surgical changes since previsit or office visit. 

## 2022-09-24 ENCOUNTER — Telehealth: Payer: Self-pay

## 2022-09-24 NOTE — Telephone Encounter (Signed)
Attempted to reach pt. With follow-up call following endoscopic procedure 09/23/2022.  Number invalid.  Unable to LM.

## 2022-09-27 ENCOUNTER — Encounter: Payer: Self-pay | Admitting: Gastroenterology

## 2022-09-29 ENCOUNTER — Telehealth: Payer: Self-pay

## 2022-09-29 DIAGNOSIS — B353 Tinea pedis: Secondary | ICD-10-CM | POA: Diagnosis not present

## 2022-09-29 DIAGNOSIS — B351 Tinea unguium: Secondary | ICD-10-CM | POA: Diagnosis not present

## 2022-09-29 DIAGNOSIS — L609 Nail disorder, unspecified: Secondary | ICD-10-CM | POA: Diagnosis not present

## 2022-09-29 NOTE — Telephone Encounter (Signed)
The recall for follow  up in 6 months with GM.

## 2022-09-29 NOTE — Telephone Encounter (Signed)
Blake Ibarra, set up follow-up in clinic with myself or one of the APP's in 3 to 6 months.

## 2022-09-30 DIAGNOSIS — K2970 Gastritis, unspecified, without bleeding: Secondary | ICD-10-CM | POA: Diagnosis not present

## 2022-09-30 DIAGNOSIS — R0789 Other chest pain: Secondary | ICD-10-CM | POA: Diagnosis not present

## 2022-10-21 DIAGNOSIS — R7309 Other abnormal glucose: Secondary | ICD-10-CM | POA: Diagnosis not present

## 2022-10-22 DIAGNOSIS — F332 Major depressive disorder, recurrent severe without psychotic features: Secondary | ICD-10-CM | POA: Diagnosis not present

## 2022-11-05 DIAGNOSIS — F332 Major depressive disorder, recurrent severe without psychotic features: Secondary | ICD-10-CM | POA: Diagnosis not present

## 2022-11-19 DIAGNOSIS — F4323 Adjustment disorder with mixed anxiety and depressed mood: Secondary | ICD-10-CM | POA: Diagnosis not present

## 2022-11-19 DIAGNOSIS — F332 Major depressive disorder, recurrent severe without psychotic features: Secondary | ICD-10-CM | POA: Diagnosis not present

## 2022-11-21 DIAGNOSIS — R7309 Other abnormal glucose: Secondary | ICD-10-CM | POA: Diagnosis not present

## 2022-11-26 DIAGNOSIS — I10 Essential (primary) hypertension: Secondary | ICD-10-CM | POA: Diagnosis not present

## 2022-11-27 DIAGNOSIS — I10 Essential (primary) hypertension: Secondary | ICD-10-CM | POA: Diagnosis not present

## 2022-11-27 DIAGNOSIS — J029 Acute pharyngitis, unspecified: Secondary | ICD-10-CM | POA: Diagnosis not present

## 2022-11-27 DIAGNOSIS — R051 Acute cough: Secondary | ICD-10-CM | POA: Diagnosis not present

## 2022-12-03 DIAGNOSIS — F4323 Adjustment disorder with mixed anxiety and depressed mood: Secondary | ICD-10-CM | POA: Diagnosis not present

## 2022-12-21 DIAGNOSIS — R7309 Other abnormal glucose: Secondary | ICD-10-CM | POA: Diagnosis not present

## 2022-12-23 DIAGNOSIS — F332 Major depressive disorder, recurrent severe without psychotic features: Secondary | ICD-10-CM | POA: Diagnosis not present

## 2022-12-24 DIAGNOSIS — F4323 Adjustment disorder with mixed anxiety and depressed mood: Secondary | ICD-10-CM | POA: Diagnosis not present

## 2023-01-21 DIAGNOSIS — R7309 Other abnormal glucose: Secondary | ICD-10-CM | POA: Diagnosis not present

## 2023-01-28 DIAGNOSIS — F332 Major depressive disorder, recurrent severe without psychotic features: Secondary | ICD-10-CM | POA: Diagnosis not present

## 2023-02-09 DIAGNOSIS — H16223 Keratoconjunctivitis sicca, not specified as Sjogren's, bilateral: Secondary | ICD-10-CM | POA: Diagnosis not present

## 2023-02-12 DIAGNOSIS — F4323 Adjustment disorder with mixed anxiety and depressed mood: Secondary | ICD-10-CM | POA: Diagnosis not present

## 2023-02-16 ENCOUNTER — Encounter: Payer: Self-pay | Admitting: Gastroenterology

## 2023-02-21 DIAGNOSIS — R7309 Other abnormal glucose: Secondary | ICD-10-CM | POA: Diagnosis not present

## 2023-02-25 DIAGNOSIS — F332 Major depressive disorder, recurrent severe without psychotic features: Secondary | ICD-10-CM | POA: Diagnosis not present

## 2023-02-25 DIAGNOSIS — F4323 Adjustment disorder with mixed anxiety and depressed mood: Secondary | ICD-10-CM | POA: Diagnosis not present

## 2023-03-19 DIAGNOSIS — I10 Essential (primary) hypertension: Secondary | ICD-10-CM | POA: Diagnosis not present

## 2023-03-21 DIAGNOSIS — R7309 Other abnormal glucose: Secondary | ICD-10-CM | POA: Diagnosis not present

## 2023-03-25 DIAGNOSIS — F4323 Adjustment disorder with mixed anxiety and depressed mood: Secondary | ICD-10-CM | POA: Diagnosis not present

## 2023-04-01 DIAGNOSIS — F4323 Adjustment disorder with mixed anxiety and depressed mood: Secondary | ICD-10-CM | POA: Diagnosis not present

## 2023-04-15 DIAGNOSIS — F4323 Adjustment disorder with mixed anxiety and depressed mood: Secondary | ICD-10-CM | POA: Diagnosis not present

## 2023-04-21 DIAGNOSIS — R7309 Other abnormal glucose: Secondary | ICD-10-CM | POA: Diagnosis not present

## 2023-05-21 DIAGNOSIS — R7309 Other abnormal glucose: Secondary | ICD-10-CM | POA: Diagnosis not present

## 2023-06-16 DIAGNOSIS — I10 Essential (primary) hypertension: Secondary | ICD-10-CM | POA: Diagnosis not present

## 2023-06-21 DIAGNOSIS — R7309 Other abnormal glucose: Secondary | ICD-10-CM | POA: Diagnosis not present

## 2023-07-21 DIAGNOSIS — R7309 Other abnormal glucose: Secondary | ICD-10-CM | POA: Diagnosis not present

## 2023-08-21 DIAGNOSIS — R7309 Other abnormal glucose: Secondary | ICD-10-CM | POA: Diagnosis not present

## 2023-09-21 DIAGNOSIS — R7309 Other abnormal glucose: Secondary | ICD-10-CM | POA: Diagnosis not present

## 2023-09-29 DIAGNOSIS — Z0189 Encounter for other specified special examinations: Secondary | ICD-10-CM | POA: Diagnosis not present

## 2023-09-29 DIAGNOSIS — Z125 Encounter for screening for malignant neoplasm of prostate: Secondary | ICD-10-CM | POA: Diagnosis not present

## 2023-09-29 DIAGNOSIS — E291 Testicular hypofunction: Secondary | ICD-10-CM | POA: Diagnosis not present

## 2023-09-30 DIAGNOSIS — L814 Other melanin hyperpigmentation: Secondary | ICD-10-CM | POA: Diagnosis not present

## 2023-09-30 DIAGNOSIS — D225 Melanocytic nevi of trunk: Secondary | ICD-10-CM | POA: Diagnosis not present

## 2023-09-30 DIAGNOSIS — L821 Other seborrheic keratosis: Secondary | ICD-10-CM | POA: Diagnosis not present

## 2023-10-01 DIAGNOSIS — R7301 Impaired fasting glucose: Secondary | ICD-10-CM | POA: Diagnosis not present

## 2023-10-01 DIAGNOSIS — E785 Hyperlipidemia, unspecified: Secondary | ICD-10-CM | POA: Diagnosis not present

## 2023-10-06 ENCOUNTER — Other Ambulatory Visit: Payer: Self-pay | Admitting: Internal Medicine

## 2023-10-06 DIAGNOSIS — I10 Essential (primary) hypertension: Secondary | ICD-10-CM | POA: Diagnosis not present

## 2023-10-06 DIAGNOSIS — E785 Hyperlipidemia, unspecified: Secondary | ICD-10-CM

## 2023-10-06 DIAGNOSIS — Z1339 Encounter for screening examination for other mental health and behavioral disorders: Secondary | ICD-10-CM | POA: Diagnosis not present

## 2023-10-06 DIAGNOSIS — R82998 Other abnormal findings in urine: Secondary | ICD-10-CM | POA: Diagnosis not present

## 2023-10-06 DIAGNOSIS — Z0000 Encounter for general adult medical examination without abnormal findings: Secondary | ICD-10-CM | POA: Diagnosis not present

## 2023-10-06 DIAGNOSIS — Z1331 Encounter for screening for depression: Secondary | ICD-10-CM | POA: Diagnosis not present

## 2023-10-21 DIAGNOSIS — R7309 Other abnormal glucose: Secondary | ICD-10-CM | POA: Diagnosis not present

## 2023-11-21 DIAGNOSIS — R7309 Other abnormal glucose: Secondary | ICD-10-CM | POA: Diagnosis not present

## 2023-12-31 DIAGNOSIS — H0288B Meibomian gland dysfunction left eye, upper and lower eyelids: Secondary | ICD-10-CM | POA: Diagnosis not present

## 2023-12-31 DIAGNOSIS — H0288A Meibomian gland dysfunction right eye, upper and lower eyelids: Secondary | ICD-10-CM | POA: Diagnosis not present

## 2023-12-31 DIAGNOSIS — H16223 Keratoconjunctivitis sicca, not specified as Sjogren's, bilateral: Secondary | ICD-10-CM | POA: Diagnosis not present
